# Patient Record
Sex: Female | Born: 1980
Health system: Southern US, Community
[De-identification: ages and names within clinical notes are randomized; demographics above are authoritative.]

## PROBLEM LIST (undated history)

## (undated) DIAGNOSIS — J45909 Unspecified asthma, uncomplicated: Secondary | ICD-10-CM

## (undated) DIAGNOSIS — R87629 Unspecified abnormal cytological findings in specimens from vagina: Secondary | ICD-10-CM

## (undated) DIAGNOSIS — Z8619 Personal history of other infectious and parasitic diseases: Secondary | ICD-10-CM

## (undated) DIAGNOSIS — G5600 Carpal tunnel syndrome, unspecified upper limb: Secondary | ICD-10-CM

## (undated) DIAGNOSIS — G43909 Migraine, unspecified, not intractable, without status migrainosus: Secondary | ICD-10-CM

## (undated) DIAGNOSIS — K219 Gastro-esophageal reflux disease without esophagitis: Secondary | ICD-10-CM

## (undated) DIAGNOSIS — O26899 Other specified pregnancy related conditions, unspecified trimester: Secondary | ICD-10-CM

## (undated) DIAGNOSIS — F419 Anxiety disorder, unspecified: Secondary | ICD-10-CM

## (undated) DIAGNOSIS — T7840XA Allergy, unspecified, initial encounter: Secondary | ICD-10-CM

## (undated) DIAGNOSIS — F32A Depression, unspecified: Secondary | ICD-10-CM

## (undated) HISTORY — DX: Anxiety disorder, unspecified: F41.9

## (undated) HISTORY — DX: Carpal tunnel syndrome, unspecified upper limb: G56.00

## (undated) HISTORY — DX: Other specified pregnancy related conditions, unspecified trimester: O26.899

## (undated) HISTORY — DX: Unspecified abnormal cytological findings in specimens from vagina: R87.629

## (undated) HISTORY — DX: Personal history of other infectious and parasitic diseases: Z86.19

## (undated) HISTORY — DX: Gastro-esophageal reflux disease without esophagitis: K21.9

## (undated) HISTORY — DX: Unspecified asthma, uncomplicated: J45.909

## (undated) HISTORY — DX: Migraine, unspecified, not intractable, without status migrainosus: G43.909

## (undated) HISTORY — PX: WISDOM TOOTH EXTRACTION: SHX21

## (undated) HISTORY — DX: Depression, unspecified: F32.A

## (undated) HISTORY — DX: Allergy, unspecified, initial encounter: T78.40XA

---

## 2003-03-29 ENCOUNTER — Other Ambulatory Visit: Admission: RE | Admit: 2003-03-29 | Discharge: 2003-03-29 | Payer: Self-pay | Admitting: Obstetrics & Gynecology

## 2003-10-22 ENCOUNTER — Inpatient Hospital Stay (HOSPITAL_COMMUNITY): Admission: AD | Admit: 2003-10-22 | Discharge: 2003-10-25 | Payer: Self-pay | Admitting: *Deleted

## 2003-12-02 ENCOUNTER — Other Ambulatory Visit: Admission: RE | Admit: 2003-12-02 | Discharge: 2003-12-02 | Payer: Self-pay | Admitting: Obstetrics & Gynecology

## 2009-02-12 ENCOUNTER — Inpatient Hospital Stay (HOSPITAL_COMMUNITY): Admission: AD | Admit: 2009-02-12 | Discharge: 2009-02-15 | Payer: Self-pay | Admitting: Obstetrics & Gynecology

## 2010-06-01 ENCOUNTER — Other Ambulatory Visit: Admission: RE | Admit: 2010-06-01 | Discharge: 2010-06-01 | Payer: Self-pay | Admitting: Gastroenterology

## 2010-12-29 LAB — CBC
HCT: 28.8 % — ABNORMAL LOW (ref 36.0–46.0)
HCT: 38.2 % (ref 36.0–46.0)
Hemoglobin: 13.3 g/dL (ref 12.0–15.0)
MCHC: 34.4 g/dL (ref 30.0–36.0)
MCHC: 34.7 g/dL (ref 30.0–36.0)
MCV: 97.5 fL (ref 78.0–100.0)
RBC: 2.95 MIL/uL — ABNORMAL LOW (ref 3.87–5.11)
RBC: 4 MIL/uL (ref 3.87–5.11)
RDW: 13.3 % (ref 11.5–15.5)
WBC: 10.4 10*3/uL (ref 4.0–10.5)

## 2011-02-05 NOTE — H&P (Signed)
NAMEJAXON, MYNHIER                           ACCOUNT NO.:  [id number]   MEDICAL RECORD NO.:  [id number]                   PATIENT TYPE:  INP   LOCATION:  9163                                 FACILITY:  [facility]   PHYSICIAN:  Genia Del, M.D.             DATE OF BIRTH:  [birth year]   DATE OF ADMISSION:  10/22/2003  DATE OF DISCHARGE:                                HISTORY & PHYSICAL   PATIENT IDENTIFICATION:  Mrs. Kathleen Conway is a 30 year old G1, 63 plus weeks  gestation; expected date of delivery October 26, 2003.   REASON FOR ADMISSION:  Induction, reason suspicion, occasional late  decelerations on monitoring at maternity admission.   HISTORY OF PRESENT ILLNESS:  The patient was seen at the office earlier  today.  She was found to be severely dehydrated because of a  gastroenteritis, probably viral in etiology.  She was sent for IV  rehydration.  On monitoring, after good rehydration, there were still  suspicious occasional late decelerations, in an otherwise reactive strip.  A  biophysical profile was done, which was 8/10 including the reactive NST; no  sustained breathing movements were present.  The amniotic fluid was normal.   The decision was made to induce the patient because of the occasional light  decelerations.   PAST MEDICAL HISTORY:  Negative.   PAST SURGICAL HISTORY:  Negative.   FAMILY HISTORY:  Positive for breast cancer in mother.  Colon cancer in  maternal grandfather.  Cardiovascular disease in maternal grandmother.   SOCIAL HISTORY:  Nonsmoker.  Married.   MEDICATIONS:  Prenatal vitamins.   HISTORY OF PRESENT PREGNANCY:  First trimester was normal.  Hemoglobin 12.5,  platelets 246, blood type Rh A positive.  DU positive.  Rh antibodies  negative.  Hemoglobin electrophoresis within normal limits.  RPR  nonreactive.  HbSAG nonreactive.  HIV nonreactive.  Rubella titer immune.  First trimester screening within normal limits.  Ultrasound dating at 10+  weeks  gave an expected date of delivery October 26, 2003.   In the second trimester alpha-fetoprotein was within normal limits.  Ultrasound revealed anatomy within normal limits, with a normal anterior  placenta.   One-hour GTT in the third trimester was within normal limits at 114.  Blood  pressures remained normal.  Uterine height corresponds well.  Group B strep  was negative at 35+ weeks.   REVIEW OF SYSTEMS:  CONSTITUTIONAL:  Negative.  HEENT:  Negative.  CARDIOVASCULAR:  Negative.  RESPIRATORY:  Negative.  GI:  Negative.  GENITOURINARY:  Negative.  ENDOCRINOLOGIC:  Negative.  DERMATOLOGIC:  Negative.  NEUROLOGIC:  Negative.   PHYSICAL EXAMINATION:  GENERAL:  No apparent distress.  VITAL SIGNS:  Blood pressure 100/66, pulse 102, respiratory rate 20,  temperature 98.4.  LUNGS:  Clear bilaterally.  CARDIAC:  Regular rhythm.  PELVIC:  Gravid uterus.  Cephalad presentation.  Vaginal examination on  admission reveals 1 cm dilated, 70% effaced, vertex  minus 2 with intact  membranes.  EXTREMITIES:  Lower limbs normal.  FETAL MONITORING:  (At Labor and Delivery) Showed a reactive monitoring,  with a baseline at 141-145; good accelerations.  Mild, occasional variable  decelerations were present.  Rare late appearing decelerations.  Uterine  contractions were irregular and mild.   IMPRESSION:  G1, 39+ weeks gestation with suspicious occasional late  decelerations on monitoring.  Overall fetal well being reassuring currently.  Group B strep negative.   PLAN:  Admit to Labor and Delivery.  Close continuous monitoring.  Induction  with low dose Pitocin.  Artificial rupture of membranes.                                               Genia Del, M.D.    ML/MEDQ  D:  10/22/2003  T:  10/22/2003  Job:  [id number]

## 2011-09-09 ENCOUNTER — Ambulatory Visit (INDEPENDENT_AMBULATORY_CARE_PROVIDER_SITE_OTHER): Payer: 59 | Admitting: Family Medicine

## 2011-09-09 DIAGNOSIS — Z23 Encounter for immunization: Secondary | ICD-10-CM

## 2011-09-09 DIAGNOSIS — M25569 Pain in unspecified knee: Secondary | ICD-10-CM

## 2011-09-09 DIAGNOSIS — F411 Generalized anxiety disorder: Secondary | ICD-10-CM

## 2011-09-09 DIAGNOSIS — G43009 Migraine without aura, not intractable, without status migrainosus: Secondary | ICD-10-CM

## 2011-12-02 ENCOUNTER — Ambulatory Visit (INDEPENDENT_AMBULATORY_CARE_PROVIDER_SITE_OTHER): Payer: 59 | Admitting: Family Medicine

## 2011-12-02 ENCOUNTER — Encounter: Payer: Self-pay | Admitting: Family Medicine

## 2011-12-02 VITALS — BP 104/68 | HR 80 | Temp 98.8°F | Resp 16 | Ht 60.5 in | Wt 132.2 lb

## 2011-12-02 DIAGNOSIS — G43909 Migraine, unspecified, not intractable, without status migrainosus: Secondary | ICD-10-CM | POA: Insufficient documentation

## 2011-12-02 MED ORDER — RIZATRIPTAN BENZOATE 10 MG PO TABS: 10.0000 mg | ORAL_TABLET | ORAL | Status: DC | PRN

## 2011-12-02 NOTE — Patient Instructions (Signed)
Information re: Headache Elimination Diet and Anti-Inflammatory Diet (print outs given)

## 2011-12-02 NOTE — Progress Notes (Signed)
  Subjective:    Patient ID: Kathleen Conway, female    DOB: [birth year], 31 y.o.   MRN: [id number]  HPI This pt returns for follow-up re: treatment of migraines. She cannot tolerate Topamax as she had  nausea and vomiting after one dose. She has kept a headache diary and found that she has occurrence  around her menses and when stressed. She has started to changes her diet and is avoiding temptation  to skip meals and snacks. She is taking Excedrin Migraine as this helps; her cousin who also has  Migraines advised her about correct administration of Maxalt. She is willing to go back to Maxalt.    Review of Systems  Constitutional: Negative.   HENT: Negative.   Cardiovascular: Negative.   Genitourinary: Positive for frequency. Negative for dysuria and urgency.       Has frequency immediately preceding a migraine  Neurological: Negative.   Psychiatric/Behavioral: Negative.        Objective:   Physical Exam  Nursing note and vitals reviewed. Constitutional: She is oriented to person, place, and time. She appears well-developed and well-nourished. No distress.  HENT:  Head: Normocephalic and atraumatic.  Eyes: EOM are normal. No scleral icterus.  Cardiovascular: Normal rate.   Pulmonary/Chest: Effort normal. No respiratory distress.  Neurological: She is alert and oriented to person, place, and time. No cranial nerve deficit.  Psychiatric: She has a normal mood and affect. Her behavior is normal. Thought content normal.          Assessment & Plan:  1. Migraine Headache    Rx: Maxalt 10 mg  Take as directed. Pt given print information re: Headache Elimination Diet and Anti-Inflammatory Diet; she will continue to make lifestyle changes                                                                                             RTC in 3-4 months

## 2012-05-16 ENCOUNTER — Encounter: Payer: Self-pay | Admitting: Family Medicine

## 2012-05-16 ENCOUNTER — Ambulatory Visit: Payer: 59 | Admitting: Family Medicine

## 2012-05-16 VITALS — BP 102/68 | HR 76 | Temp 97.9°F | Resp 16 | Ht 61.0 in | Wt 132.2 lb

## 2012-05-16 DIAGNOSIS — M25569 Pain in unspecified knee: Secondary | ICD-10-CM

## 2012-05-16 DIAGNOSIS — Z0000 Encounter for general adult medical examination without abnormal findings: Secondary | ICD-10-CM

## 2012-05-16 DIAGNOSIS — M25562 Pain in left knee: Secondary | ICD-10-CM

## 2012-05-16 DIAGNOSIS — G43909 Migraine, unspecified, not intractable, without status migrainosus: Secondary | ICD-10-CM

## 2012-05-16 DIAGNOSIS — G8929 Other chronic pain: Secondary | ICD-10-CM | POA: Insufficient documentation

## 2012-05-16 DIAGNOSIS — R292 Abnormal reflex: Secondary | ICD-10-CM

## 2012-05-16 LAB — POCT UA - MICROSCOPIC ONLY
Casts, Ur, LPF, POC: NEGATIVE
Crystals, Ur, HPF, POC: NEGATIVE
Yeast, UA: NEGATIVE

## 2012-05-16 LAB — POCT URINALYSIS DIPSTICK
Bilirubin, UA: NEGATIVE
Blood, UA: NEGATIVE
pH, UA: 7

## 2012-05-16 LAB — BASIC METABOLIC PANEL
BUN: 11 mg/dL (ref 6–23)
CO2: 25 mEq/L (ref 19–32)
Chloride: 106 mEq/L (ref 96–112)
Glucose, Bld: 93 mg/dL (ref 70–99)
Potassium: 3.9 mEq/L (ref 3.5–5.3)
Sodium: 139 mEq/L (ref 135–145)

## 2012-05-16 NOTE — Progress Notes (Signed)
Subjective:    Patient ID: Kathleen Conway, female    DOB: 30-Sep-1980, 31 y.o.   MRN: [id number]  HPI This 31 y.o. AA female is here for CPE only (she had PAP in June 2013 with Dr. Seymour Bars at  [facility]). She has chronic migraine headache disorder and diary has revealed  correlation with menstrual cycle. There are no food triggers. Headache is located usually in left  frontal area over eye. She did take OCPs in 2004 and  recalls no HAs at that time and no adverse  effects with the pill. She has had increased HAs since childbirth and worsening of migraine after  birth of 2nd child. She also notes some seasonality to migraines (less occurrence during the   summer and more in spring and fall).She is requesting increase of current Rizatriptan medication.   She also needs FMLA form signed for the coming year.     Review of Systems  Constitutional: Negative.   Eyes: Negative.   Respiratory: Negative.   Cardiovascular: Negative.   Gastrointestinal: Negative.   Genitourinary: Negative.   Musculoskeletal: Positive for joint swelling.       Left knee- chronic swelling and discomfort intermittently since 2010 (xray- negative)  Skin: Positive for color change.  Neurological: Positive for headaches. Negative for dizziness, syncope, speech difficulty, weakness and numbness.  Hematological: Negative.   Psychiatric/Behavioral: Positive for disturbed wake/sleep cycle. Negative for confusion, dysphoric mood, decreased concentration and agitation. The patient is not nervous/anxious.        Objective:   Physical Exam  Nursing note and vitals reviewed. Constitutional: She is oriented to person, place, and time. She appears well-developed and well-nourished. No distress.  HENT:  Head: Normocephalic and atraumatic.  Right Ear: Hearing, tympanic membrane, external ear and ear canal normal.  Left Ear: Hearing, tympanic membrane and external ear normal.  Nose: Nose normal. No nasal deformity  or septal deviation.  Mouth/Throat: Uvula is midline, oropharynx is clear and moist and mucous membranes are normal. No oral lesions. Normal dentition. No dental caries.  Eyes: Conjunctivae and EOM are normal. Pupils are equal, round, and reactive to light. No scleral icterus.  Neck: Normal range of motion. Neck supple.  Cardiovascular: Normal rate, regular rhythm and normal heart sounds.  Exam reveals no gallop and no friction rub.   No murmur heard. Pulmonary/Chest: Effort normal and breath sounds normal. No respiratory distress.  Abdominal: Soft. She exhibits no distension and no mass. There is no hepatosplenomegaly. There is no tenderness. There is no guarding and no CVA tenderness.  Musculoskeletal: Normal range of motion. She exhibits tenderness. She exhibits no edema.       Left knee- 3-4 scattered erythematous lesions (not raised; nonblanching) around joint. Tibial plateau tenderness but normal ROM. No effusion, no crepitus. No joint instability noted.  Lymphadenopathy:    She has no cervical adenopathy.  Neurological: She is alert and oriented to person, place, and time. She displays abnormal reflex. No cranial nerve deficit. She exhibits normal muscle tone. Coordination normal.  Reflex Scores:      Bicep reflexes are 3+ on the right side and 3+ on the left side.      Patellar reflexes are 3+ on the right side and 3+ on the left side. Skin: Skin is warm and dry.  Psychiatric: She has a normal mood and affect. Her behavior is normal. Judgment and thought content normal.    Results for orders placed in visit on 05/16/12  POCT URINALYSIS DIPSTICK  Component Value Range   Color, UA yellow     Clarity, UA clear     Glucose, UA neg     Bilirubin, UA neg     Ketones, UA trace     Spec Grav, UA 1.020     Blood, UA neg     pH, UA 7.0     Protein, UA trace     Urobilinogen, UA 1.0     Nitrite, UA neg     Leukocytes, UA moderate (2+)    POCT UA - MICROSCOPIC ONLY      Component  Value Range   WBC, Ur, HPF, POC 2-20     RBC, urine, microscopic 0-5     Bacteria, U Microscopic 1+     Mucus, UA moderate     Epithelial cells, urine per micros 0-tntc     Crystals, Ur, HPF, POC neg     Casts, Ur, LPF, POC neg     Yeast, UA neg            Assessment & Plan:   1. Routine general medical examination at a health care facility  POCT urinalysis dipstick, Basic metabolic panel, POCT UA - Microscopic Only  2. Migraine headache - have discussed with pt the use of Continuous Oral Contraception (i.e. LoSeasonique ) for treatment of menstrual migraine Continue current medication-Maxalt 10 mg prn. Try OTC Vit B12  1 mg  Taken once a day Get OTC supplement- Calcium+Magnesium+ Zinc and take 1 tablet daily Pt will consider use of OCPs but first wants to try other supplements noted above  3. Generalized hyperreflexia  TSH, T4, Free  4. Chronic pain of left knee  Sedimentation Rate   FMLA form is completed.

## 2012-05-16 NOTE — Patient Instructions (Addendum)
Based on our discussion today, you may have Menstrual migraine headache disorder which may respond to continuous contraception (like with LoSeasonique OCP). You would take this medication daily for 3 months and not have any menstrual bleeding. This may help reduce migraine headache occurrence.  I want you to give this some thought and continue to monitor your headaches.   Also I want you to try Vit B12 supplement 1 mg - take 1 tablet daily. Another supplement is Calcium+ Magnesium+ Zinc  Take 1 tablet daily. Get OTC Vit D 1000 IU and take this daily to keep Vitamin D level in normal range.

## 2012-05-17 NOTE — Progress Notes (Signed)
Quick Note:  Please notify pt that results are normal.   Provide pt with copy of labs. ______ 

## 2012-09-20 DIAGNOSIS — Z0271 Encounter for disability determination: Secondary | ICD-10-CM

## 2012-11-09 ENCOUNTER — Encounter: Payer: Self-pay | Admitting: Family Medicine

## 2012-11-09 ENCOUNTER — Ambulatory Visit (INDEPENDENT_AMBULATORY_CARE_PROVIDER_SITE_OTHER): Payer: 59 | Admitting: Family Medicine

## 2012-11-09 VITALS — BP 106/68 | HR 95 | Temp 98.5°F | Resp 16 | Ht 61.0 in | Wt 132.0 lb

## 2012-11-09 DIAGNOSIS — R040 Epistaxis: Secondary | ICD-10-CM

## 2012-11-09 DIAGNOSIS — J309 Allergic rhinitis, unspecified: Secondary | ICD-10-CM

## 2012-11-09 DIAGNOSIS — R350 Frequency of micturition: Secondary | ICD-10-CM

## 2012-11-09 DIAGNOSIS — G971 Other reaction to spinal and lumbar puncture: Secondary | ICD-10-CM

## 2012-11-09 DIAGNOSIS — G43909 Migraine, unspecified, not intractable, without status migrainosus: Secondary | ICD-10-CM

## 2012-11-09 LAB — POCT UA - MICROSCOPIC ONLY
Casts, Ur, LPF, POC: NEGATIVE
Crystals, Ur, HPF, POC: NEGATIVE

## 2012-11-09 LAB — POCT URINALYSIS DIPSTICK
Glucose, UA: NEGATIVE
Protein, UA: NEGATIVE
Spec Grav, UA: 1.025
Urobilinogen, UA: 0.2
pH, UA: 6

## 2012-11-09 MED ORDER — RIZATRIPTAN BENZOATE 10 MG PO TABS: 10.0000 mg | ORAL_TABLET | ORAL | Status: DC | PRN

## 2012-11-09 MED ORDER — CEFUROXIME AXETIL 250 MG PO TABS: 250.0000 mg | ORAL_TABLET | Freq: Two times a day (BID) | ORAL | Status: DC

## 2012-11-09 NOTE — Progress Notes (Signed)
S:  This 32 y.o. AA female is here for follow-up re: migraines. She has been awakening w/ HAs but thinks this is due to lack of sleep. HAs are L-sided.  HAs have been intermittent for 2+ years. She reports 2 episodes of epistaxis (from left nostril); she had nose bleeds as a child. Also has allergies and had red eyes last week; OTC allergy eye drops were effective. Nasal congestion is a problem, mostly at bedtime.   Urinary frequency (up to 4x hs) is disturbing sleep. She has more trips to restroom at work; she thinks it is stress-related. She denies fever, flank pain, hematuria or dysuria.  ROS: As per HPI; negative for sinus pain or pressure, nasal drainage or sore throat, neck pain or stiffness, vision disturbance or aura, n/v, tremor, numbness or weakness or syncope.   O: Filed Vitals:   11/09/12 1501  BP: 106/68  Pulse: 95  Temp: 98.5 F (36.9 C)  Resp: 16   GEN: In NAD; WN,WD. HEENT:  [place]/AT; EOMI w/ mild injection bilat. Nasal mucosa is red and boggy w/o active bleeding. EACs normal. NT sinuses. NECK: Supplpe w/o LAN or TMG; no muscle spasms. COR: RRR. LUNGS: Normal resp rate and effort. NEURO: A&O x 3; Cns intact. Nonfocal.   Results for orders placed in visit on 11/09/12  URINE CULTURE      Result Value Range   Colony Count NO GROWTH     Organism ID, Bacteria NO GROWTH    POCT URINALYSIS DIPSTICK      Result Value Range   Color, UA yellow     Clarity, UA slightly cloudy     Glucose, UA neg     Bilirubin, UA neg     Ketones, UA neg     Spec Grav, UA 1.025     Blood, UA trace     pH, UA 6.0     Protein, UA neg     Urobilinogen, UA 0.2     Nitrite, UA neg     Leukocytes, UA small (1+)    POCT UA - MICROSCOPIC ONLY      Result Value Range   WBC, Ur, HPF, POC 8-tntc     RBC, urine, microscopic 0-4     Bacteria, U Microscopic trace     Mucus, UA trace     Epithelial cells, urine per micros 3-16     Crystals, Ur, HPF, POC neg     Casts, Ur, LPF, POC neg     Yeast, UA neg       A/P: Urinary frequency - Plan: Urine culture; RX; Cefuroxime 250 mg 1 tablet bid x 1 week pending culture.  Migraine, unspecified, without mention of intractable migraine without mention of status migrainosus - RF: Maxalt 10 mg tablets.  Plan: CT Head Wo Contrast  Allergic rhinitis- use OTC antihistamine and humidifier in bedroom while sleeping.  Epistaxis   Meds ordered this encounter  Medications  . etonogestrel-ethinyl estradiol (NUVARING) 0.12-0.015 MG/24HR vaginal ring    Sig: Place 1 each vaginally every 28 (twenty-eight) days. Insert vaginally and leave in place for 3 consecutive weeks, then remove for 1 week.  . cefUROXime (CEFTIN) 250 MG tablet    Sig: Take 1 tablet (250 mg total) by mouth 2 (two) times daily.    Dispense:  14 tablet    Refill:  0  . rizatriptan (MAXALT) 10 MG tablet    Sig: Take 1 tablet (10 mg total) by mouth as needed for migraine. May  repeat in 2 hours if needed    Dispense:  10 tablet    Refill:  2

## 2012-11-09 NOTE — Patient Instructions (Addendum)
Allergic Rhinitis Allergic rhinitis is when the mucous membranes in the nose respond to allergens. Allergens are particles in the air that cause your body to have an allergic reaction. This causes you to release allergic antibodies. Through a chain of events, these eventually cause you to release histamine into the blood stream (hence the use of antihistamines). Although meant to be protective to the body, it is this release that causes your discomfort, such as frequent sneezing, congestion and an itchy runny nose.  CAUSES  The pollen allergens may come from grasses, trees, and weeds. This is seasonal allergic rhinitis, or "hay fever." Other allergens cause year-round allergic rhinitis (perennial allergic rhinitis) such as house dust mite allergen, pet dander and mold spores.  SYMPTOMS   Nasal stuffiness (congestion).  Runny, itchy nose with sneezing and tearing of the eyes.  There is often an itching of the mouth, eyes and ears. It cannot be cured, but it can be controlled with medications. DIAGNOSIS  If you are unable to determine the offending allergen, skin or blood testing may find it. TREATMENT   Avoid the allergen.  Medications and allergy shots (immunotherapy) can help.  Hay fever may often be treated with antihistamines in pill or nasal spray forms. Antihistamines block the effects of histamine. There are over-the-counter medicines that may help with nasal congestion and swelling around the eyes. Check with your caregiver before taking or giving this medicine. If the treatment above does not work, there are many new medications your caregiver can prescribe. Stronger medications may be used if initial measures are ineffective. Desensitizing injections can be used if medications and avoidance fails. Desensitization is when a patient is given ongoing shots until the body becomes less sensitive to the allergen. Make sure you follow up with your caregiver if problems continue. SEEK MEDICAL  CARE IF:   You develop fever (more than 100.5 F (38.1 C).  You develop a cough that does not stop easily (persistent).  You have shortness of breath.  You start wheezing.  Symptoms interfere with normal daily activities. Document Released: 06/01/2001 Document Revised: 11/29/2011 Document Reviewed: 12/11/2008 [facility] Patient Information 2013 [place], [place].   Get a humidifier and use in the bedroom as you sleep. You can get generic Zyrtec or Allegra (with or without decongestant) and take this medication every evening to help reduce swelling in nasal passages.  Your urine is being sent for culture to check for bladder infection. I will prescribe days of medication as a precaution.

## 2012-11-11 LAB — URINE CULTURE: Colony Count: NO GROWTH

## 2012-11-17 ENCOUNTER — Telehealth: Payer: Self-pay

## 2012-11-20 ENCOUNTER — Ambulatory Visit
Admission: RE | Admit: 2012-11-20 | Discharge: 2012-11-20 | Disposition: A | Discharge status: Home / Self Care | Payer: 59 | Source: Ambulatory Visit | Attending: Family Medicine | Admitting: Family Medicine

## 2012-11-20 DIAGNOSIS — G43909 Migraine, unspecified, not intractable, without status migrainosus: Secondary | ICD-10-CM

## 2012-11-21 ENCOUNTER — Encounter: Payer: Self-pay | Admitting: Family Medicine

## 2013-03-15 ENCOUNTER — Ambulatory Visit (INDEPENDENT_AMBULATORY_CARE_PROVIDER_SITE_OTHER): Payer: 59 | Admitting: Family Medicine

## 2013-03-15 ENCOUNTER — Encounter: Payer: Self-pay | Admitting: Family Medicine

## 2013-03-15 VITALS — BP 95/59 | HR 86 | Temp 97.9°F | Resp 16 | Ht 60.5 in | Wt 127.0 lb

## 2013-03-15 DIAGNOSIS — G43909 Migraine, unspecified, not intractable, without status migrainosus: Secondary | ICD-10-CM

## 2013-03-15 DIAGNOSIS — G8929 Other chronic pain: Secondary | ICD-10-CM

## 2013-03-15 DIAGNOSIS — M25562 Pain in left knee: Secondary | ICD-10-CM

## 2013-03-15 DIAGNOSIS — M25569 Pain in unspecified knee: Secondary | ICD-10-CM

## 2013-03-15 MED ORDER — RIZATRIPTAN BENZOATE 10 MG PO TABS: 10.0000 mg | ORAL_TABLET | ORAL | Status: DC | PRN

## 2013-03-15 NOTE — Progress Notes (Signed)
S:  This 32 y.o. AA female has migraine HA disorder; HA frequency decreased such that Aleve usually relieves frontal area discomfort. Early morning HAs have been awakening pt in last few weeks. She notes HAs w/ weather changes and when allergy symptoms are not well controlled. She has not used Maxalt in several months. She admits hydration is fair; avoids drinking much water during the day to limit bathroom breaks at work. Recently had vision evaluation.  Chronic L knee pain- this has been a problem for years. Xrays 3 years ago were normal. No recent trauma but pt was very athletic as a teen. 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 She did sprain knee while participating in these sports. Now, she has persistent red splotches around L knee joint but has no swelling. She states knee "just aches". Family hx is essentially negative for arthritis diagnoses. Pt denies fever, fatigue, myalgias, other arthralgias, rashes, erythema, bruising or enlarged lymph nodes.  Pt reports that she is no longer using contraception; she is not actively trying to conceive but, if pregnancy occurs, it would be welcomed.  Patient Active Problem List   Diagnosis Date Noted  . Chronic pain of left knee 05/16/2012  . Migraine headache 12/02/2011   PMHx, Soc Hx and Fam Hx reviewed.  ROS: As per HPI.  O: Filed Vitals:   03/15/13 1621  BP: 95/59  Pulse: 86  Temp: 97.9 F (36.6 C)  Resp: 16   GEN: In NAD; WN,WD. HEENT: [place]/AT. NT sinuses. EOMI w/ clear conj/sclerae. EACs/ TMs normal. Nasal mucosa w/ enlarged turbinates. Post ph w/ mild cobblestoning. NECK: Supple w/o LAN or TMG. COR: RRR. LUNGS: CTA. Normal resp rate and effort. BACK: No muscle tenderness. Spine straight w/o curvature. MS: MAEs; no joint effusions or crepitus. No deformities. L knee- FROM; small flat red lesions around patella. No obvious leg length discrepancy. NEURO: A&Ox 3; CNs intact. Motor/sensory intact. Gait normal. Nonfocal.    A/P: Migraine, unspecified, without mention of intractable migraine without mention of status migrainosus- Improve hydration and sleep hygiene. 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 Take allergy medication daily.  Chronic knee pain, left - Plan: Ambulatory referral to Orthopedic Surgery  Meds ordered this encounter  Medications  . rizatriptan (MAXALT) 10 MG tablet    Sig: Take 1 tablet (10 mg total) by mouth as needed for migraine. May repeat in 2 hours if needed    Dispense:  10 tablet    Refill:  3

## 2013-03-15 NOTE — Patient Instructions (Addendum)
For headache disorder- try putting AIR PURIFIERS in your bedroom and maybe in the family room. This device will help clean the air which is full of allergens. Maintain adequate hydration, especially in the summer.  I have referred you to Dr. Althea Charon at [facility]. They will probably need to do xrays and other imaging as warranted.  You can call back to schedule your next appointment (physical without PAP) before the end of the year.

## 2013-07-26 ENCOUNTER — Other Ambulatory Visit: Payer: Self-pay

## 2013-08-31 ENCOUNTER — Encounter: Payer: Self-pay | Admitting: Family Medicine

## 2013-08-31 ENCOUNTER — Ambulatory Visit (INDEPENDENT_AMBULATORY_CARE_PROVIDER_SITE_OTHER): Payer: 59 | Admitting: Family Medicine

## 2013-08-31 VITALS — BP 120/76 | HR 65 | Temp 98.3°F | Resp 16 | Ht 61.0 in | Wt 128.0 lb

## 2013-08-31 DIAGNOSIS — Z23 Encounter for immunization: Secondary | ICD-10-CM

## 2013-08-31 DIAGNOSIS — G43909 Migraine, unspecified, not intractable, without status migrainosus: Secondary | ICD-10-CM

## 2013-08-31 MED ORDER — RIZATRIPTAN BENZOATE 10 MG PO TABS: 10.0000 mg | ORAL_TABLET | ORAL | Status: DC | PRN

## 2013-08-31 NOTE — Patient Instructions (Signed)
Try a cup of warm green tea in the morning during the winter to see if that will help minimize your headaches.

## 2013-09-02 NOTE — Progress Notes (Signed)
S:  This 32 y.o. AA female has migraine HA disorder that is controlled on current medication (prn Maxalt generic).She notes increased HA frequency w/  weather changes and around time of menses.  With onset of cold weather, she wakes up w/ sinus pressure, nasal congestion and frontal HA. In general, she is doing well and requests continuation of current medications.  Patient Active Problem List   Diagnosis Date Noted  . Chronic pain of left knee 05/16/2012  . Migraine headache 12/02/2011   PMHx, Soc Hx and Fam Hx reviewed.  Medications reconciled.  ROS: Noncontributory.  O: Filed Vitals:   08/31/13 1625  BP: 120/76  Pulse: 65  Temp: 98.3 F (36.8 C)  Resp: 16   GEN: In NAD: WN,WD. HENT: [place]/AT; EOMI w/ clear conj/sclerae. Nasal mucosa erythematous and edematous. Post ph normal. NT sinuses. NECK: Supple w/o LAN. COR: RRR. LUNGS: Unlabored resp. SKIN: W&D; intact w/o diaphoresis or erythema. NEURO: A&Ox 3; CNs intact. Nonfocal.  A/P: Migraine headache- Stable on current medication; continue prn Maxalt (generic). Advised warm liquids and humidifier in the winter.  Need for prophylactic vaccination and inoculation against influenza - Plan: Flu Vaccine QUAD 36+ mos IM

## 2013-09-05 NOTE — Telephone Encounter (Signed)
Error

## 2013-11-20 DIAGNOSIS — Z029 Encounter for administrative examinations, unspecified: Secondary | ICD-10-CM

## 2013-11-26 ENCOUNTER — Telehealth: Payer: Self-pay

## 2013-11-26 ENCOUNTER — Encounter: Payer: Self-pay | Admitting: Family Medicine

## 2013-11-26 NOTE — Telephone Encounter (Signed)
FMLA forms are currently with Dr.Mcphersons Box awaiting completion.

## 2013-11-26 NOTE — Telephone Encounter (Addendum)
FORMS HAVE BEEN COMPLETED AND FAXED THERE IS A COPY IN THE P/U DRAWER IF PT WOULD LIKE TO PICK THIS UP AND DOCUMENTS HAVE BEEN SCANNED AND UNDER THE MEDIA TAB. THE FAX DID GO THROUGH TO [employer].

## 2014-06-07 ENCOUNTER — Ambulatory Visit (INDEPENDENT_AMBULATORY_CARE_PROVIDER_SITE_OTHER): Payer: 59 | Admitting: Family Medicine

## 2014-06-07 ENCOUNTER — Encounter: Payer: Self-pay | Admitting: Family Medicine

## 2014-06-07 ENCOUNTER — Encounter: Payer: 59 | Admitting: Family Medicine

## 2014-06-07 VITALS — BP 96/60 | HR 65 | Temp 97.9°F | Resp 16 | Ht 61.5 in | Wt 122.0 lb

## 2014-06-07 DIAGNOSIS — Z0000 Encounter for general adult medical examination without abnormal findings: Secondary | ICD-10-CM

## 2014-06-07 DIAGNOSIS — G43009 Migraine without aura, not intractable, without status migrainosus: Secondary | ICD-10-CM

## 2014-06-07 DIAGNOSIS — Z23 Encounter for immunization: Secondary | ICD-10-CM

## 2014-06-07 DIAGNOSIS — J309 Allergic rhinitis, unspecified: Secondary | ICD-10-CM

## 2014-06-07 DIAGNOSIS — Z569 Unspecified problems related to employment: Secondary | ICD-10-CM

## 2014-06-07 DIAGNOSIS — R5381 Other malaise: Secondary | ICD-10-CM

## 2014-06-07 DIAGNOSIS — Z566 Other physical and mental strain related to work: Secondary | ICD-10-CM

## 2014-06-07 DIAGNOSIS — R5383 Other fatigue: Secondary | ICD-10-CM

## 2014-06-07 LAB — CBC WITH DIFFERENTIAL/PLATELET
Basophils Absolute: 0 10*3/uL (ref 0.0–0.1)
Basophils Relative: 0 % (ref 0–1)
EOS ABS: 0.1 10*3/uL (ref 0.0–0.7)
EOS PCT: 1 % (ref 0–5)
HEMATOCRIT: 36 % (ref 36.0–46.0)
HEMOGLOBIN: 12.4 g/dL (ref 12.0–15.0)
LYMPHS ABS: 2.5 10*3/uL (ref 0.7–4.0)
LYMPHS PCT: 39 % (ref 12–46)
MCH: 30.5 pg (ref 26.0–34.0)
MCHC: 34.4 g/dL (ref 30.0–36.0)
MCV: 88.7 fL (ref 78.0–100.0)
MONO ABS: 0.4 10*3/uL (ref 0.1–1.0)
MONOS PCT: 6 % (ref 3–12)
Neutro Abs: 3.5 10*3/uL (ref 1.7–7.7)
Neutrophils Relative %: 54 % (ref 43–77)
PLATELETS: 309 10*3/uL (ref 150–400)
RBC: 4.06 MIL/uL (ref 3.87–5.11)
RDW: 13.1 % (ref 11.5–15.5)
WBC: 6.4 10*3/uL (ref 4.0–10.5)

## 2014-06-07 LAB — POCT URINALYSIS DIPSTICK
BILIRUBIN UA: NEGATIVE
Blood, UA: NEGATIVE
Glucose, UA: NEGATIVE
KETONES UA: NEGATIVE
LEUKOCYTES UA: NEGATIVE
NITRITE UA: NEGATIVE
PROTEIN UA: NEGATIVE
Spec Grav, UA: 1.015
Urobilinogen, UA: 0.2
pH, UA: 5.5

## 2014-06-07 LAB — COMPLETE METABOLIC PANEL WITH GFR
ALT: 8 U/L (ref 0–35)
AST: 12 U/L (ref 0–37)
Albumin: 4.5 g/dL (ref 3.5–5.2)
Alkaline Phosphatase: 50 U/L (ref 39–117)
BILIRUBIN TOTAL: 0.6 mg/dL (ref 0.2–1.2)
BUN: 7 mg/dL (ref 6–23)
CALCIUM: 9.7 mg/dL (ref 8.4–10.5)
CHLORIDE: 103 meq/L (ref 96–112)
CO2: 27 meq/L (ref 19–32)
CREATININE: 0.79 mg/dL (ref 0.50–1.10)
GLUCOSE: 80 mg/dL (ref 70–99)
Potassium: 4 mEq/L (ref 3.5–5.3)
Sodium: 138 mEq/L (ref 135–145)
Total Protein: 6.8 g/dL (ref 6.0–8.3)

## 2014-06-07 LAB — LIPID PANEL
CHOL/HDL RATIO: 2.9 ratio
CHOLESTEROL: 179 mg/dL (ref 0–200)
HDL: 62 mg/dL (ref >39)
LDL Cholesterol: 105 mg/dL — ABNORMAL HIGH (ref 0–99)
Triglycerides: 59 mg/dL (ref <150)
VLDL: 12 mg/dL (ref 0–40)

## 2014-06-07 MED ORDER — RIZATRIPTAN BENZOATE 10 MG PO TABS: 10.0000 mg | ORAL_TABLET | ORAL | Status: DC | PRN

## 2014-06-07 NOTE — Progress Notes (Signed)
Subjective:    Patient ID: Kathleen Conway, female    DOB: September 06, 1981, 33 y.o.   MRN: [id number]  HPI  This 33 y.o. AA female is here for CPE. PAP/pelvic/GYN care provided by Dr. Dellis Filbert (current). Chronic medical conditions include migraine HA disorder and environmental allergies.   HCM: PAP- As noted.           MMG- Per GYN recommendation.           IMM- Current.           Vision- > 2 years.   Patient Active Problem List   Diagnosis Date Noted  . Chronic pain of left knee 05/16/2012  . Migraine headache 12/02/2011    Prior to Admission medications   Medication Sig Start Date End Date Taking? Authorizing Provider  aspirin-acetaminophen-caffeine (EXCEDRIN MIGRAINE) [phone] MG per tablet Take 1 tablet by mouth every 6 (six) hours as needed.   Yes Historical Provider, MD  Multiple Vitamin (MULTIVITAMIN) tablet Take 1 tablet by mouth daily.   Yes Historical Provider, MD  rizatriptan (MAXALT) 10 MG tablet Take 1 tablet (10 mg total) by mouth as needed for migraine. May repeat in 2 hours if needed 06/07/14 06/07/15 Yes Barton Fanny, MD  Norgestim-Eth Radene Journey Triphasic [facility] TRI-CYCLEN, 28, PO) Take by mouth.    Historical Provider, MD    History   Social History  . Marital Status: Married    Spouse Name: N/A    Number of Children: 2 sons  . Years of Education: college   Occupational History  . customer service representative    Social History Main Topics  . Smoking status: Never Smoker   . Smokeless tobacco: Never Used  . Alcohol Use: Yes     Comment: socially-wine  . Drug Use: Not on file  . Sexual Activity: Yes   Other Topics Concern  . Not on file   Social History Narrative  . No narrative on file    Family History  Problem Relation Age of Onset  . Cancer Mother 66    breast  . Cancer Father 86    prostate  . Stroke Maternal Grandfather   . Cancer Maternal Grandfather     colon  . Cancer Maternal Aunt     Breast and lung  . Cancer Maternal  Uncle     Lung  . Cancer Paternal Grandfather      Review of Systems  Constitutional: Positive for fatigue. Negative for activity change, appetite change and unexpected weight change.  HENT: Positive for congestion. Negative for sinus pressure and sore throat.   Eyes: Negative.   Respiratory: Negative.   Cardiovascular: Negative.   Gastrointestinal: Negative.   Endocrine: Negative.   Genitourinary: Negative.   Musculoskeletal: Negative.   Skin: Negative.   Allergic/Immunologic: Positive for environmental allergies.  Neurological: Positive for headaches.  Psychiatric/Behavioral: Positive for dysphoric mood, decreased concentration and agitation.       Associated w/ lack of career satisfaction; is carving out more personal time and hs decided to pursue Master's Degree (online education).       Objective:   Physical Exam  Nursing note and vitals reviewed. Constitutional: She is oriented to person, place, and time. Vital signs are normal. She appears well-developed and well-nourished.  HENT:  Head: Normocephalic and atraumatic.  Right Ear: Hearing, tympanic membrane, external ear and ear canal normal.  Left Ear: Hearing, tympanic membrane, external ear and ear canal normal.  Nose: Mucosal edema present. No nasal deformity or  septal deviation. Right sinus exhibits no maxillary sinus tenderness and no frontal sinus tenderness. Left sinus exhibits no maxillary sinus tenderness and no frontal sinus tenderness.  Mouth/Throat: Uvula is midline and mucous membranes are normal. No oral lesions. Normal dentition. No dental caries. Posterior oropharyngeal erythema present. No oropharyngeal exudate.  Eyes: Conjunctivae, EOM and lids are normal. Pupils are equal, round, and reactive to light. No scleral icterus.  Fundoscopic exam:      The right eye shows no papilledema. The right eye shows red reflex.       The left eye shows no papilledema. The left eye shows red reflex.  Neck: Trachea  normal, normal range of motion, full passive range of motion without pain and phonation normal. Neck supple. No spinous process tenderness and no muscular tenderness present. No mass and no thyromegaly present.  Cardiovascular: Normal rate, regular rhythm, S1 normal, S2 normal, normal heart sounds, intact distal pulses and normal pulses.   No extrasystoles are present. PMI is not displaced.  Exam reveals no gallop and no friction rub.   No murmur heard. Pulmonary/Chest: Effort normal and breath sounds normal. No respiratory distress.  Abdominal: Soft. Normal appearance and bowel sounds are normal. She exhibits no distension and no mass. There is no hepatosplenomegaly. There is no tenderness. There is no guarding and no CVA tenderness.  Genitourinary:  Breast and pelvic exam deferred.  Musculoskeletal:       Cervical back: Normal.       Thoracic back: Normal.       Lumbar back: Normal.  Remainder of exam unremarkable.  Lymphadenopathy:       Head (right side): No submental, no submandibular, no tonsillar, no preauricular, no posterior auricular and no occipital adenopathy present.       Head (left side): No submental, no submandibular, no tonsillar, no preauricular, no posterior auricular and no occipital adenopathy present.    She has no cervical adenopathy.       Right: No supraclavicular adenopathy present.       Left: No supraclavicular adenopathy present.  Neurological: She is alert and oriented to person, place, and time. She has normal strength and normal reflexes. She displays no atrophy. No cranial nerve deficit or sensory deficit. She exhibits normal muscle tone. Coordination and gait normal.  Skin: Skin is warm, dry and intact. No ecchymosis, no lesion and no rash noted. She is not diaphoretic. No cyanosis or erythema. Nails show no clubbing.  Psychiatric: She has a normal mood and affect. Her speech is normal and behavior is normal. Judgment and thought content normal. Cognition and  memory are normal.  Somewhat tearful. Pt able to articulate what issues she is dealing with and has a plan for moving forward. PHQ-9 score= 1.     Results for orders placed in visit on 06/07/14  POCT URINALYSIS DIPSTICK      Result Value Ref Range   Color, UA yellow     Clarity, UA clear     Glucose, UA neg     Bilirubin, UA neg     Ketones, UA neg     Spec Grav, UA 1.015     Blood, UA neg     pH, UA 5.5     Protein, UA neg     Urobilinogen, UA 0.2     Nitrite, UA neg     Leukocytes, UA Negative         Assessment & Plan:  Routine general medical examination at a health care  facility - Plan: POCT urinalysis dipstick, Lipid panel, CBC with Differential, COMPLETE METABOLIC PANEL WITH GFR, Vit D  25 hydroxy (rtn osteoporosis monitoring), Thyroid Panel With TSH  Work-related stress- Pt has outlined some goals for the near future which helps her feel a sense of control. She plans to pursue healthy ways of stress reduction. If symptoms worsen, pt will consider medication.  Migraine without aura and without status migrainosus, not intractable- Stable; continue Maxalt prn. 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  Other fatigue- Associated w/ job stress and other responsibilities or marriage and parenthood.  Allergic rhinitis, unspecified allergic rhinitis type  Need for prophylactic vaccination and inoculation against influenza - Plan: Flu Vaccine QUAD 36+ mos IM   Meds ordered this encounter  Medications  . rizatriptan (MAXALT) 10 MG tablet    Sig: Take 1 tablet (10 mg total) by mouth as needed for migraine. May repeat in 2 hours if needed    Dispense:  10 tablet    Refill:  5

## 2014-06-07 NOTE — Patient Instructions (Signed)
Keeping You Healthy  Get These Tests 1. Blood Pressure- Have your blood pressure checked once a year by your health care provider.  Normal blood pressure is 120/80. 2. Weight- Have your body mass index (BMI) calculated to screen for obesity.  BMI is measure of body fat based on height and weight.  You can also calculate your own BMI at GravelBags.it. 3. Cholesterol- Have your cholesterol checked every 5 years starting at age 35 then yearly starting at age 35. 88. Chlamydia, HIV, and other sexually transmitted diseases- Get screened every year until age 35, then within three months of each new sexual provider. 5. Pap Smear- Every 1-3 years; discuss with your health care provider. 6. Mammogram- Every year starting at age 35  Take these medicines  Calcium with Vitamin D-Your body needs 1200 mg of Calcium each day and [phone] IU of Vitamin D daily.  Your body can only absorb 500 mg of Calcium at a time so Calcium must be taken in 2 or 3 divided doses throughout the day.  Multivitamin with folic acid- Once daily if it is possible for you to become pregnant.  Get these Immunizations  Gardasil-Series of three doses; prevents HPV related illness such as genital warts and cervical cancer.  Menactra-Single dose; prevents meningitis.  Tetanus shot- Every 10 years.  Flu shot-Every year.  Take these steps 1. Do not smoke-Your healthcare provider can help you quit.  For tips on how to quit go to www.smokefree.gov or call 1-800 QUITNOW. 2. Be physically active- Exercise 5 days a week for at least 30 minutes.  If you are not already physically active, start slow and gradually work up to 30 minutes of moderate physical activity.  Examples of moderate activity include walking briskly, dancing, swimming, bicycling, etc. 3. Breast Cancer- A self breast exam every month is important for early detection of breast cancer.  For more information and instruction on self breast exams, ask your  healthcare provider or https://www.patel.info/. 4. Eat a healthy diet- Eat a variety of healthy foods such as fruits, vegetables, whole grains, low fat milk, low fat cheeses, yogurt, lean meats, poultry and fish, beans, nuts, tofu, etc.  For more information go to www. Thenutritionsource.org 5. Drink alcohol in moderation- Limit alcohol intake to one drink or less per day. Never drink and drive. 6. Depression- Your emotional health is as important as your physical health.  If you're feeling down or losing interest in things you normally enjoy please talk to your healthcare provider about being screened for depression. 7. Dental visit- Brush and floss your teeth twice daily; visit your dentist twice a year. 8. Eye doctor- Get an eye exam at least every 2 years. 9. Helmet use- Always wear a helmet when riding a bicycle, motorcycle, rollerblading or skateboarding. 40. Safe sex- If you may be exposed to sexually transmitted infections, use a condom. 11. Seat belts- Seat belts can save your live; always wear one. 12. Smoke/Carbon Monoxide detectors- These detectors need to be installed on the appropriate level of your home. Replace batteries at least once a year. 13. Skin cancer- When out in the sun please cover up and use sunscreen 15 SPF or higher. 14. Violence- If anyone is threatening or hurting you, please tell your healthcare provider.    Stress and Stress Management Stress is a normal reaction to life events. It is what you feel when life demands more than you are used to or more than you can handle. Some stress can be useful.  For example, the stress reaction can help you catch the last bus of the day, study for a test, or meet a deadline at work. But stress that occurs too often or for too long can cause problems. It can affect your emotional health and interfere with relationships and normal daily activities. Too much stress can weaken your immune system and increase your  risk for physical illness. If you already have a medical problem, stress can make it worse. CAUSES  All sorts of life events may cause stress. An event that causes stress for one person may not be stressful for another person. Major life events commonly cause stress. These may be positive or negative. Examples include losing your job, moving into a new home, getting married, having a baby, or losing a loved one. Less obvious life events may also cause stress, especially if they occur day after day or in combination. Examples include working long hours, driving in traffic, caring for children, being in debt, or being in a difficult relationship. SIGNS AND SYMPTOMS Stress may cause emotional symptoms including, the following:  Anxiety. This is feeling worried, afraid, on edge, overwhelmed, or out of control.  Anger. This is feeling irritated or impatient.  Depression. This is feeling sad, down, helpless, or guilty.  Difficulty focusing, remembering, or making decisions. Stress may cause physical symptoms, including the following:   Aches and pains. These may affect your head, neck, back, stomach, or other areas of your body.  Tight muscles or clenched jaw.  Low energy or trouble sleeping. Stress may cause unhealthy behaviors, including the following:   Eating to feel better (overeating) or skipping meals.  Sleeping too little, too much, or both.  Working too much or putting off tasks (procrastination).  Smoking, drinking alcohol, or using drugs to feel better. DIAGNOSIS  Stress is diagnosed through an assessment by your health care provider. Your health care provider will ask questions about your symptoms and any stressful life events.Your health care provider will also ask about your medical history and may order blood tests or other tests. Certain medical conditions and medicine can cause physical symptoms similar to stress. Mental illness can cause emotional symptoms and unhealthy  behaviors similar to stress. Your health care provider may refer you to a mental health professional for further evaluation.  TREATMENT  Stress management is the recommended treatment for stress.The goals of stress management are reducing stressful life events and coping with stress in healthy ways.  Techniques for reducing stressful life events include the following:  Stress identification. Self-monitor for stress and identify what causes stress for you. These skills may help you to avoid some stressful events.  Time management. Set your priorities, keep a calendar of events, and learn to say "no." These tools can help you avoid making too many commitments. Techniques for coping with stress include the following:  Rethinking the problem. Try to think realistically about stressful events rather than ignoring them or overreacting. Try to find the positives in a stressful situation rather than focusing on the negatives.  Exercise. Physical exercise can release both physical and emotional tension. The key is to find a form of exercise you enjoy and do it regularly.  Relaxation techniques. These relax the body and mind. Examples include yoga, meditation, tai chi, biofeedback, deep breathing, progressive muscle relaxation, listening to music, being out in nature, journaling, and other hobbies. Again, the key is to find one or more that you enjoy and can do regularly.  Healthy lifestyle. Eat a  balanced diet, get plenty of sleep, and do not smoke. Avoid using alcohol or drugs to relax.  Strong support network. Spend time with family, friends, or other people you enjoy being around.Express your feelings and talk things over with someone you trust. Counseling or talktherapy with a mental health professional may be helpful if you are having difficulty managing stress on your own. Medicine is typically not recommended for the treatment of stress.Talk to your health care provider if you think you need  medicine for symptoms of stress. HOME CARE INSTRUCTIONS  Keep all follow-up visits as directed by your health care provider.  Take all medicines as directed by your health care provider. SEEK MEDICAL CARE IF:  Your symptoms get worse or you start having new symptoms.  You feel overwhelmed by your problems and can no longer manage them on your own. SEEK IMMEDIATE MEDICAL CARE IF:  You feel like hurting yourself or someone else. Document Released: 03/02/2001 Document Revised: 01/21/2014 Document Reviewed: 05/01/2013 [employer] Patient Information 2015 [place], [place]. This information is not intended to replace advice given to you by your health care provider. Make sure you discuss any questions you have with your health care provider.

## 2014-06-08 DIAGNOSIS — J309 Allergic rhinitis, unspecified: Secondary | ICD-10-CM | POA: Insufficient documentation

## 2014-06-08 DIAGNOSIS — Z566 Other physical and mental strain related to work: Secondary | ICD-10-CM | POA: Insufficient documentation

## 2014-06-08 DIAGNOSIS — J3089 Other allergic rhinitis: Secondary | ICD-10-CM | POA: Insufficient documentation

## 2014-06-08 LAB — THYROID PANEL WITH TSH
Free Thyroxine Index: 2 (ref 1.4–3.8)
T3 UPTAKE: 30 % (ref 22.0–35.0)
T4 TOTAL: 6.5 ug/dL (ref 4.5–12.0)
TSH: 1.188 u[IU]/mL (ref 0.350–4.500)

## 2014-06-08 LAB — VITAMIN D 25 HYDROXY (VIT D DEFICIENCY, FRACTURES): Vit D, 25-Hydroxy: 27 ng/mL — ABNORMAL LOW (ref 30–89)

## 2014-08-16 ENCOUNTER — Encounter (HOSPITAL_COMMUNITY): Payer: Self-pay | Admitting: *Deleted

## 2014-08-19 ENCOUNTER — Other Ambulatory Visit: Payer: Self-pay | Admitting: Obstetrics & Gynecology

## 2014-08-20 ENCOUNTER — Ambulatory Visit (HOSPITAL_COMMUNITY)
Admission: AD | Admit: 2014-08-20 | Discharge: 2014-08-20 | Disposition: A | Discharge status: Home / Self Care | Payer: 59 | Source: Ambulatory Visit | Attending: Obstetrics & Gynecology | Admitting: Obstetrics & Gynecology

## 2014-08-20 ENCOUNTER — Encounter (HOSPITAL_COMMUNITY): Payer: Self-pay | Admitting: Anesthesiology

## 2014-08-20 ENCOUNTER — Ambulatory Visit (HOSPITAL_COMMUNITY): Payer: 59 | Admitting: Certified Registered Nurse Anesthetist

## 2014-08-20 ENCOUNTER — Encounter (HOSPITAL_COMMUNITY)
Admission: AD | Disposition: A | Discharge status: Home / Self Care | Payer: Self-pay | Source: Ambulatory Visit | Attending: Obstetrics & Gynecology

## 2014-08-20 DIAGNOSIS — O021 Missed abortion: Secondary | ICD-10-CM | POA: Insufficient documentation

## 2014-08-20 DIAGNOSIS — G43909 Migraine, unspecified, not intractable, without status migrainosus: Secondary | ICD-10-CM | POA: Insufficient documentation

## 2014-08-20 DIAGNOSIS — Z6823 Body mass index (BMI) 23.0-23.9, adult: Secondary | ICD-10-CM | POA: Insufficient documentation

## 2014-08-20 DIAGNOSIS — E669 Obesity, unspecified: Secondary | ICD-10-CM | POA: Diagnosis not present

## 2014-08-20 HISTORY — PX: DILATION AND EVACUATION: SHX1459

## 2014-08-20 LAB — CBC
HCT: 37.4 % (ref 36.0–46.0)
Hemoglobin: 12.5 g/dL (ref 12.0–15.0)
MCH: 31.6 pg (ref 26.0–34.0)
MCHC: 33.4 g/dL (ref 30.0–36.0)
MCV: 94.7 fL (ref 78.0–100.0)
PLATELETS: 274 10*3/uL (ref 150–400)
RBC: 3.95 MIL/uL (ref 3.87–5.11)
RDW: 12.8 % (ref 11.5–15.5)
WBC: 7.2 10*3/uL (ref 4.0–10.5)

## 2014-08-20 LAB — ABO/RH: ABO/RH(D): A POS

## 2014-08-20 SURGERY — DILATION AND EVACUATION, UTERUS
Anesthesia: Monitor Anesthesia Care | Site: Uterus

## 2014-08-20 MED ORDER — SCOPOLAMINE 1 MG/3DAYS TD PT72
1.0000 | MEDICATED_PATCH | Freq: Once | TRANSDERMAL | Status: AC
  Administered 2014-08-20: 1 via TRANSDERMAL
  Administered 2014-08-20: 1.5 mg via TRANSDERMAL

## 2014-08-20 MED ORDER — SILVER NITRATE-POT NITRATE 75-25 % EX MISC
CUTANEOUS | Status: DC | PRN
  Administered 2014-08-20: 3

## 2014-08-20 MED ORDER — MIDAZOLAM HCL 2 MG/2ML IJ SOLN
INTRAMUSCULAR | Status: DC | PRN
  Administered 2014-08-20: 2 mg via INTRAVENOUS

## 2014-08-20 MED ORDER — MIDAZOLAM HCL 2 MG/2ML IJ SOLN
INTRAMUSCULAR | Status: AC
  Filled 2014-08-20: qty 2

## 2014-08-20 MED ORDER — CEFAZOLIN SODIUM-DEXTROSE 2-3 GM-% IV SOLR
INTRAVENOUS | Status: AC
  Filled 2014-08-20: qty 50

## 2014-08-20 MED ORDER — ONDANSETRON HCL 4 MG/2ML IJ SOLN
INTRAMUSCULAR | Status: AC
  Filled 2014-08-20: qty 2

## 2014-08-20 MED ORDER — CEFAZOLIN SODIUM-DEXTROSE 2-3 GM-% IV SOLR
2.0000 g | INTRAVENOUS | Status: AC
  Administered 2014-08-20: 2 g via INTRAVENOUS

## 2014-08-20 MED ORDER — 0.9 % SODIUM CHLORIDE (POUR BTL) OPTIME
TOPICAL | Status: DC | PRN
  Administered 2014-08-20: 1000 mL

## 2014-08-20 MED ORDER — PROPOFOL 10 MG/ML IV EMUL
INTRAVENOUS | Status: DC | PRN
  Administered 2014-08-20 (×2): 50 mg via INTRAVENOUS

## 2014-08-20 MED ORDER — PROPOFOL 10 MG/ML IV EMUL
INTRAVENOUS | Status: AC
  Filled 2014-08-20: qty 20

## 2014-08-20 MED ORDER — LIDOCAINE HCL 1 % IJ SOLN
INTRAMUSCULAR | Status: DC | PRN
  Administered 2014-08-20: 20 mL

## 2014-08-20 MED ORDER — LIDOCAINE HCL 1 % IJ SOLN
INTRAMUSCULAR | Status: AC
  Filled 2014-08-20: qty 20

## 2014-08-20 MED ORDER — GLYCOPYRROLATE 0.2 MG/ML IJ SOLN
INTRAMUSCULAR | Status: DC | PRN
  Administered 2014-08-20: 0.2 mg via INTRAVENOUS

## 2014-08-20 MED ORDER — FENTANYL CITRATE 0.05 MG/ML IJ SOLN
25.0000 ug | INTRAMUSCULAR | Status: DC | PRN
  Administered 2014-08-20: 50 ug via INTRAVENOUS

## 2014-08-20 MED ORDER — METOCLOPRAMIDE HCL 5 MG/ML IJ SOLN: 10.0000 mg | Freq: Once | INTRAMUSCULAR | Status: DC | PRN

## 2014-08-20 MED ORDER — LACTATED RINGERS IV SOLN
INTRAVENOUS | Status: DC
  Administered 2014-08-20 (×2): via INTRAVENOUS

## 2014-08-20 MED ORDER — LIDOCAINE HCL (CARDIAC) 20 MG/ML IV SOLN
INTRAVENOUS | Status: AC
  Filled 2014-08-20: qty 5

## 2014-08-20 MED ORDER — FENTANYL CITRATE 0.05 MG/ML IJ SOLN
INTRAMUSCULAR | Status: DC | PRN
  Administered 2014-08-20: 100 ug via INTRAVENOUS

## 2014-08-20 MED ORDER — LIDOCAINE HCL (CARDIAC) 20 MG/ML IV SOLN
INTRAVENOUS | Status: DC | PRN
  Administered 2014-08-20: 60 mg via INTRAVENOUS

## 2014-08-20 MED ORDER — MEPERIDINE HCL 25 MG/ML IJ SOLN: 6.2500 mg | INTRAMUSCULAR | Status: DC | PRN

## 2014-08-20 MED ORDER — ONDANSETRON HCL 4 MG/2ML IJ SOLN
INTRAMUSCULAR | Status: DC | PRN
  Administered 2014-08-20: 4 mg via INTRAVENOUS

## 2014-08-20 MED ORDER — FENTANYL CITRATE 0.05 MG/ML IJ SOLN
INTRAMUSCULAR | Status: AC
  Filled 2014-08-20: qty 2

## 2014-08-20 MED ORDER — SCOPOLAMINE 1 MG/3DAYS TD PT72
MEDICATED_PATCH | TRANSDERMAL | Status: AC
  Administered 2014-08-20: 1.5 mg via TRANSDERMAL
  Filled 2014-08-20: qty 1

## 2014-08-20 MED ORDER — GLYCOPYRROLATE 0.2 MG/ML IJ SOLN
INTRAMUSCULAR | Status: AC
  Filled 2014-08-20: qty 1

## 2014-08-20 MED ORDER — KETOROLAC TROMETHAMINE 30 MG/ML IJ SOLN
INTRAMUSCULAR | Status: AC
  Filled 2014-08-20: qty 1

## 2014-08-20 MED ORDER — HYDROCODONE-IBUPROFEN 5-200 MG PO TABS: 1.0000 | ORAL_TABLET | Freq: Four times a day (QID) | ORAL | Status: DC | PRN

## 2014-08-20 MED ORDER — KETOROLAC TROMETHAMINE 30 MG/ML IJ SOLN
INTRAMUSCULAR | Status: DC | PRN
  Administered 2014-08-20: 30 mg via INTRAVENOUS

## 2014-08-20 SURGICAL SUPPLY — 20 items
CATH ROBINSON RED A/P 16FR (CATHETERS) ×2 IMPLANT
CLOTH BEACON ORANGE TIMEOUT ST (SAFETY) ×2 IMPLANT
DECANTER SPIKE VIAL GLASS SM (MISCELLANEOUS) ×2 IMPLANT
GLOVE BIO SURGEON STRL SZ 6.5 (GLOVE) ×2 IMPLANT
GLOVE BIOGEL PI IND STRL 7.0 (GLOVE) ×2 IMPLANT
GLOVE BIOGEL PI IND STRL 7.5 (GLOVE) ×1 IMPLANT
GLOVE BIOGEL PI INDICATOR 7.0 (GLOVE) ×2
GLOVE BIOGEL PI INDICATOR 7.5 (GLOVE) ×1
GLOVE ECLIPSE 7.0 STRL STRAW (GLOVE) ×2 IMPLANT
GOWN STRL REUS W/TWL LRG LVL3 (GOWN DISPOSABLE) ×4 IMPLANT
KIT BERKELEY 1ST TRIMESTER 3/8 (MISCELLANEOUS) ×2 IMPLANT
PACK VAGINAL MINOR WOMEN LF (CUSTOM PROCEDURE TRAY) ×2 IMPLANT
PAD OB MATERNITY 4.3X12.25 (PERSONAL CARE ITEMS) ×2 IMPLANT
PAD PREP 24X48 CUFFED NSTRL (MISCELLANEOUS) ×2 IMPLANT
SET BERKELEY SUCTION TUBING (SUCTIONS) ×2 IMPLANT
TOWEL OR 17X24 6PK STRL BLUE (TOWEL DISPOSABLE) ×4 IMPLANT
VACURETTE 10 RIGID CVD (CANNULA) IMPLANT
VACURETTE 7MM CVD STRL WRAP (CANNULA) IMPLANT
VACURETTE 8 RIGID CVD (CANNULA) IMPLANT
VACURETTE 9 RIGID CVD (CANNULA) ×2 IMPLANT

## 2014-08-20 NOTE — Progress Notes (Signed)
Patients preliminary ABO/rh listed blood type as A neg. Dr Assunta Curtis office blood work listed patient as A pos. Blood bank notified for further clarification. Blood bank further investigated discrepancy and now states patient is A pos. States they will correct patients chart to reflect change.

## 2014-08-20 NOTE — Anesthesia Postprocedure Evaluation (Signed)
  Anesthesia Post-op Note  Patient: Kathleen Conway  Procedure(s) Performed: Procedure(s): DILATATION AND EVACUATION (N/A)  Patient Location: PACU  Anesthesia Type:MAC  Level of Consciousness: awake, alert  and oriented  Airway and Oxygen Therapy: Patient Spontanous Breathing  Post-op Pain: none  Post-op Assessment: Post-op Vital signs reviewed, Patient's Cardiovascular Status Stable, Respiratory Function Stable, Patent Airway, No signs of Nausea or vomiting and Pain level controlled  Post-op Vital Signs: Reviewed and stable  Last Vitals:  Filed Vitals:   08/20/14 1045  BP: 107/66  Pulse: 67  Temp:   Resp: 16    Complications: No apparent anesthesia complications

## 2014-08-20 NOTE — Discharge Summary (Signed)
  Physician Discharge Summary  Patient ID: Kathleen Conway MRN: [id number] DOB/AGE: 1981/07/10 33 y.o.  Admit date: 08/20/2014 Discharge date: 08/20/2014  Admission Diagnoses: Missed Abortion  Discharge Diagnoses: Missed Abortion        Active Problems:   * No active hospital problems. *   Discharged Condition: good  Hospital Course: Outpatient  Consults: None  Treatments: surgery: D+E suction  Disposition:      Medication List    TAKE these medications        aspirin-acetaminophen-caffeine 250-250-65 MG per tablet  Commonly known as:  EXCEDRIN MIGRAINE  Take 1 tablet by mouth every 6 (six) hours as needed for headache.     EPIDUO EX  Apply 1 application topically at bedtime.     hydrocodone-ibuprofen 5-200 MG per tablet  Commonly known as:  VICOPROFEN  Take 1 tablet by mouth every 6 (six) hours as needed for pain.     ibuprofen 200 MG tablet  Commonly known as:  ADVIL,MOTRIN  Take 400-600 mg by mouth every 6 (six) hours as needed for cramping.     loratadine 10 MG tablet  Commonly known as:  CLARITIN  Take 10 mg by mouth daily as needed for allergies.     multivitamin tablet  Take 1 tablet by mouth daily.     ORTHO TRI-CYCLEN (28) PO  Take by mouth.     rizatriptan 10 MG tablet  Commonly known as:  MAXALT  Take 1 tablet (10 mg total) by mouth as needed for migraine. May repeat in 2 hours if needed           Follow-up Information    Follow up with Mayson Mcneish,MARIE-LYNE, MD In 3 weeks.   Specialty:  Obstetrics and Gynecology   Contact information:   [address] [place] [id number] [phone]       Signed: Princess Bruins, MD 08/20/2014, 9:51 AM

## 2014-08-20 NOTE — Transfer of Care (Signed)
Immediate Anesthesia Transfer of Care Note  Patient: Kathleen Conway  Procedure(s) Performed: Procedure(s): DILATATION AND EVACUATION (N/A)  Patient Location: PACU  Anesthesia Type:MAC  Level of Consciousness: awake, alert  and oriented  Airway & Oxygen Therapy: Patient Spontanous Breathing and Patient connected to nasal cannula oxygen  Post-op Assessment: Report given to PACU RN, Post -op Vital signs reviewed and stable and Patient moving all extremities X 4  Post vital signs: Reviewed and stable  Complications: No apparent anesthesia complications

## 2014-08-20 NOTE — Anesthesia Preprocedure Evaluation (Addendum)
Anesthesia Evaluation  Patient identified by MRN, date of birth, ID band Patient awake    Reviewed: Allergy & Precautions, H&P , NPO status , Patient's Chart, lab work & pertinent test results  Airway Mallampati: Trach   Neck ROM: Full    Dental no notable dental hx. (+) Teeth Intact   Pulmonary neg pulmonary ROS,  breath sounds clear to auscultation  Pulmonary exam normal       Cardiovascular negative cardio ROS  Rhythm:Regular Rate:Normal     Neuro/Psych  Headaches, negative psych ROS   GI/Hepatic negative GI ROS, Neg liver ROS,   Endo/Other  negative endocrine ROS  Renal/GU negative Renal ROS  negative genitourinary   Musculoskeletal negative musculoskeletal ROS (+)   Abdominal (+) - obese,   Peds  Hematology negative hematology ROS (+)   Anesthesia Other Findings   Reproductive/Obstetrics (+) Pregnancy Missed Ab                             Anesthesia Physical Anesthesia Plan  ASA: II  Anesthesia Plan: MAC   Post-op Pain Management:    Induction: Intravenous  Airway Management Planned:   Additional Equipment:   Intra-op Plan:   Post-operative Plan:   Informed Consent: I have reviewed the patients History and Physical, chart, labs and discussed the procedure including the risks, benefits and alternatives for the proposed anesthesia with the patient or authorized representative who has indicated his/her understanding and acceptance.   Dental advisory given  Plan Discussed with: Anesthesiologist, CRNA and Surgeon  Anesthesia Plan Comments:        Anesthesia Quick Evaluation

## 2014-08-20 NOTE — Op Note (Signed)
08/20/2014  9:38 AM  PATIENT:  Kathleen Conway  33 y.o. female  PRE-OPERATIVE DIAGNOSIS:  Missed Abortion First Trimester  POST-OPERATIVE DIAGNOSIS:  Missed Abortion First Trimester  PROCEDURE:  Procedure(s): DILATATION AND EVACUATION  SURGEON:  Surgeon(s): Princess Bruins, MD  ASSISTANTS: none   ANESTHESIA:   paracervical block and MAC   PROCEDURE:  On the macular analgesia the patient is an lithotomy position. She was prepped with Betadine on the supra-pubic, vulvar and vaginal areas. The bladder was catheterized. The patient is draped as usual. The vaginal exam reveals an anteverted uterus about 8 cm small bowel no adnexal mass. No vaginal bleeding is present. The speculum is inserted in the vagina. The anterior lip of the cervix was grasped with a tenaculum. A paracervical block is done with lidocaine 1% a total of 20 cc at 4 and 8:00. Dilation of the cervix with Hegar dilators up to #29 without difficulty. We then used a #9 curved curette for suction.  Suction of the products of conception corresponding to about 8 weeks' gestation. We then used a sharp curette to gently systematically curettage the intrauterine cavity on all surfaces. We go back with the suction curette to make sure all products of conception and blood clots are removed. The uterus contracts well on the instrument.  The suction curet was removed. The tenaculum is removed.  Silver nitrate is used on the cervix to complete hemostasis. Hemostasis is adequate at all levels. The speculum is removed. The patient is brought to recovery room in good and stable status.  ESTIMATED BLOOD LOSS: 15 cc   Intake/Output Summary (Last 24 hours) at 08/20/14 2505 Last data filed at 08/20/14 0930  Gross per 24 hour  Intake      0 ml  Output     40 ml  Net    -40 ml     BLOOD ADMINISTERED:none   LOCAL MEDICATIONS USED:  Amount: 20 ml of Lidocaine 1%  SPECIMEN:  Source of Specimen:  Products of conception  DISPOSITION OF  SPECIMEN:  PATHOLOGY  COUNTS:  YES  PLAN OF CARE: Transfer to PACU   Princess Bruins MD  08/20/2014 at 9:38 am

## 2014-08-20 NOTE — Discharge Instructions (Addendum)
Dilation and Curettage or Vacuum Curettage Dilation and curettage (D&C) and vacuum curettage are minor procedures. A D&C involves stretching (dilation) the cervix and scraping (curettage) the inside lining of the womb (uterus). During a D&C, tissue is gently scraped from the inside lining of the uterus. During a vacuum curettage, the lining and tissue in the uterus are removed with the use of gentle suction.  Curettage may be performed to either diagnose or treat a problem. As a diagnostic procedure, curettage is performed to examine tissues from the uterus. A diagnostic curettage may be performed for the following symptoms:   Irregular bleeding in the uterus.   Bleeding with the development of clots.   Spotting between menstrual periods.   Prolonged menstrual periods.   Bleeding after menopause.   No menstrual period (amenorrhea).   A change in size and shape of the uterus.  As a treatment procedure, curettage may be performed for the following reasons:   Removal of an IUD (intrauterine device).   Removal of retained placenta after giving birth. Retained placenta can cause an infection or bleeding severe enough to require transfusions.   Abortion.   Miscarriage.   Removal of polyps inside the uterus.   Removal of uncommon types of noncancerous lumps (fibroids).  LET [facility] CARE PROVIDER KNOW ABOUT:   Any allergies you have.   All medicines you are taking, including vitamins, herbs, eye drops, creams, and over-the-counter medicines.   Previous problems you or members of your family have had with the use of anesthetics.   Any blood disorders you have.   Previous surgeries you have had.   Medical conditions you have. RISKS AND COMPLICATIONS  Generally, this is a safe procedure. However, as with any procedure, complications can occur. Possible complications include:  Excessive bleeding.   Infection of the uterus.   Damage to the cervix.    Development of scar tissue (adhesions) inside the uterus, later causing abnormal amounts of menstrual bleeding.   Complications from the general anesthetic, if a general anesthetic is used.   Putting a hole (perforation) in the uterus. This is rare.  BEFORE THE PROCEDURE   Eat and drink before the procedure only as directed by your health care provider.   Arrange for someone to take you home.  PROCEDURE  This procedure usually takes about 15-30 minutes.  You will be given one of the following:  A medicine that numbs the area in and around the cervix (local anesthetic).   A medicine to make you sleep through the procedure (general anesthetic).  You will lie on your back with your legs in stirrups.   A warm metal or plastic instrument (speculum) will be placed in your vagina to keep it open and to allow the health care provider to see the cervix.  There are two ways in which your cervix can be softened and dilated. These include:   Taking a medicine.   Having thin rods (laminaria) inserted into your cervix.   A curved tool (curette) will be used to scrape cells from the inside lining of the uterus. In some cases, gentle suction is applied with the curette. The curette will then be removed.  AFTER THE PROCEDURE   You will rest in the recovery area until you are stable and are ready to go home.   You may feel sick to your stomach (nauseous) or throw up (vomit) if you were given a general anesthetic.   You may have a sore throat if a tube  was placed in your throat during general anesthesia.   You may have light cramping and bleeding. This may last for 2 days to 2 weeks after the procedure.   Your uterus needs to make a new lining after the procedure. This may make your next period late. Document Released: 09/06/2005 Document Revised: 05/09/2013 Document Reviewed: 04/05/2013 [facility] Patient Information 2015 [place], [place]. This information is not intended to  replace advice given to you by your health care provider. Make sure you discuss any questions you have with your health care provider.   DISCHARGE INSTRUCTIONS: D&C / D&E The following instructions have been prepared to help you care for yourself upon your return home.   Personal hygiene:  Use sanitary pads for vaginal drainage, not tampons.  Shower the day after your procedure.  NO tub baths, pools or Jacuzzis for 2-3 weeks.  Wipe front to back after using the bathroom.  Activity and limitations:  Do NOT drive or operate any equipment for 24 hours. The effects of anesthesia are still present and drowsiness may result.  Do NOT rest in bed all day.  Walking is encouraged.  Walk up and down stairs slowly.  You may resume your normal activity in one to two days or as indicated by your physician.  Sexual activity: NO intercourse for at least 2 weeks after the procedure, or as indicated by your physician.  Diet: Eat a light meal as desired this evening. You may resume your usual diet tomorrow.  Return to work: You may resume your work activities in one to two days or as indicated by your doctor.  What to expect after your surgery: Expect to have vaginal bleeding/discharge for 2-3 days and spotting for up to 10 days. It is not unusual to have soreness for up to 1-2 weeks. You may have a slight burning sensation when you urinate for the first day. Mild cramps may continue for a couple of days. You may have a regular period in 2-6 weeks.  NO IBUPROFEN PRODUCTS (MOTRIN, ADVIL) OR ALEVE UNTIL 3:30PM TODAY.   Call your doctor for any of the following:  Excessive vaginal bleeding, saturating and changing one pad every hour.  Inability to urinate 6 hours after discharge from hospital.  Pain not relieved by pain medication.  Fever of 100.4 F or greater.  Unusual vaginal discharge or odor.   Call for an appointment:    Patients signature: ______________________  Nurses  signature ________________________  Support person's signature_______________________

## 2014-08-20 NOTE — H&P (Signed)
Kathleen Conway is an 33 y.o. female G1P0   RP:  MAB for D+E  Pertinent Gynecological History: Bleeding: 1st trimester bleeding Contraception: none Blood transfusions: none Sexually transmitted diseases: no past history Last mammogram: None  Last pap: normal  OB History: G1P0   Menstrual History  Patient's last menstrual period was 05/24/2014 (approximate).    Past Medical History  Diagnosis Date  . Migraines   . Allergy     Past Surgical History  Procedure Laterality Date  . Wisdom tooth extraction      Family History  Problem Relation Age of Onset  . Cancer Mother 14    breast  . Cancer Father 45    prostate  . Stroke Maternal Grandfather   . Cancer Maternal Grandfather     colon  . Cancer Maternal Aunt     Breast and lung  . Cancer Maternal Uncle     Lung  . Cancer Paternal Grandfather     Social History:  reports that she has never smoked. She has never used smokeless tobacco. She reports that she drinks alcohol. She reports that she does not use illicit drugs.  Allergies:  Allergies  Allergen Reactions  . Topamax Nausea And Vomiting    No prescriptions prior to admission    ROS  Height 5\' 1"  (1.549 m), weight 56.7 kg (125 lb), last menstrual period 05/24/2014. Physical Exam  No results found for this or any previous visit (from the past 24 hour(s)).  No results found.  Assessment/Plan: 1st trimester MAB for D+E.  Surgery and risks reviewed.  Kathleen Conway,MARIE-LYNE 08/20/2014, 8:08 AM

## 2014-08-21 ENCOUNTER — Encounter (HOSPITAL_COMMUNITY): Payer: Self-pay | Admitting: Obstetrics & Gynecology

## 2014-08-29 ENCOUNTER — Ambulatory Visit (INDEPENDENT_AMBULATORY_CARE_PROVIDER_SITE_OTHER): Payer: 59 | Admitting: Family Medicine

## 2014-08-29 ENCOUNTER — Encounter: Payer: Self-pay | Admitting: Family Medicine

## 2014-08-29 VITALS — BP 106/76 | HR 86 | Temp 98.6°F | Resp 16 | Ht 61.25 in | Wt 123.0 lb

## 2014-08-29 DIAGNOSIS — F439 Reaction to severe stress, unspecified: Secondary | ICD-10-CM

## 2014-08-29 DIAGNOSIS — Z566 Other physical and mental strain related to work: Secondary | ICD-10-CM

## 2014-08-29 DIAGNOSIS — Z029 Encounter for administrative examinations, unspecified: Secondary | ICD-10-CM

## 2014-08-29 DIAGNOSIS — G43009 Migraine without aura, not intractable, without status migrainosus: Secondary | ICD-10-CM

## 2014-08-29 DIAGNOSIS — F430 Acute stress reaction: Secondary | ICD-10-CM

## 2014-08-29 DIAGNOSIS — O039 Complete or unspecified spontaneous abortion without complication: Secondary | ICD-10-CM

## 2014-08-29 MED ORDER — CLONAZEPAM 0.5 MG PO TABS: ORAL_TABLET | ORAL | Status: DC

## 2014-08-29 NOTE — Patient Instructions (Signed)
I have prescribed Clonazepam 0.5 mg for help with sleep or anxiety. You can take 1/2 tablet at bedtime; if that does not help you get at least 6-7 hours of sleep, then take 1 whole tablet.  If you find that 1/2 tablet is adequate for calming you but not making you sleepy, you can take 1/2 tablet during the day if needed for anxiety.  Contact me if you have questions,concerns or need anything else.  I would like to see you again in 3-4 months.

## 2014-08-29 NOTE — Progress Notes (Signed)
Subjective:    Patient ID: Kathleen Conway, female    DOB: [birth year], 33 y.o.   MRN: [id number]  HPI  This 33 y.o. AA female is here for follow-up re: migraine HA disorder. She recently had intractable migraine resulting from tremendous stress in her life; she suffered a miscarriage at 8 weeks and underwent D&E in 08/20/2014. Pt has significant job stress and had returned to school to pursue an advanced degree. She has 2 sons and her husband is very supportive. She has a few close friends that she can confide in and who support her. Pt's mother is deceased but she has maternal aunts who want to help her through this difficult time.  Pt speaks very highly of nursing staff at the hospital- professional, compassionate and attentive.  She had a migraine last week that lasted several days; multiple doses of Maxalt were required to relieve HA. Last dose was on Saturday ( 5 days ago). She is back at work after missing 3 days (covered by [employer]). She needs new FMLA paperwork completed. Appetite is fair but she is not sleeping well.   Patient Active Problem List   Diagnosis Date Noted  . Work-related stress 06/08/2014  . Rhinitis, allergic 06/08/2014  . Chronic pain of left knee 05/16/2012  . Migraine headache 12/02/2011    Prior to Admission medications   Medication Sig Start Date End Date Taking? Authorizing Provider  Adapalene-Benzoyl Peroxide (EPIDUO EX) Apply 1 application topically at bedtime.   Yes Historical Provider, MD  aspirin-acetaminophen-caffeine (EXCEDRIN MIGRAINE) [phone] MG per tablet Take 1 tablet by mouth every 6 (six) hours as needed for headache.    Yes Historical Provider, MD  ibuprofen (ADVIL,MOTRIN) 200 MG tablet Take 400-600 mg by mouth every 6 (six) hours as needed for cramping.   Yes Historical Provider, MD  loratadine (CLARITIN) 10 MG tablet Take 10 mg by mouth daily as needed for allergies.   Yes Historical Provider, MD  Multiple Vitamin (MULTIVITAMIN) tablet Take 1  tablet by mouth daily.   Yes Historical Provider, MD  rizatriptan (MAXALT) 10 MG tablet Take 1 tablet (10 mg total) by mouth as needed for migraine. May repeat in 2 hours if needed 06/07/14 06/07/15 Yes Barton Fanny, MD  Norgestim-Eth Radene Journey Triphasic [facility] TRI-CYCLEN, 28, PO) Take by mouth.    Historical Provider, MD    History   Social History  . Marital Status: Married    Spouse Name: N/A    Number of Children: N/A  . Years of Education: college   Occupational History  . customer service representative    Social History Main Topics  . Smoking status: Never Smoker   . Smokeless tobacco: Never Used  . Alcohol Use: Yes     Comment: socially-wine but none with pregnancy  . Drug Use: No  . Sexual Activity: Not on file     Comment: approx [redacted] wks gestation   Other Topics Concern  . Not on file   Social History Narrative   FAMILY Hx reviewed.   Review of Systems  Constitutional: Positive for appetite change and fatigue. Negative for fever.  Respiratory: Negative.   Cardiovascular: Negative.   Gastrointestinal: Negative.   Skin: Negative.   Neurological: Positive for headaches. Negative for dizziness, syncope, speech difficulty, weakness and numbness.  Psychiatric/Behavioral: Positive for sleep disturbance and dysphoric mood.       Objective:   Physical Exam  Constitutional: She is oriented to person, place, and time. She appears well-developed and well-nourished.  No distress.  HENT:  Head: Normocephalic and atraumatic.  Right Ear: External ear normal.  Left Ear: External ear normal.  Mouth/Throat: Oropharynx is clear and moist.  Eyes: Conjunctivae and EOM are normal. Pupils are equal, round, and reactive to light. No scleral icterus.  Cardiovascular: Normal rate and regular rhythm.   Pulmonary/Chest: Effort normal. No respiratory distress.  Musculoskeletal: Normal range of motion.  Neurological: She is alert and oriented to person, place, and time. No cranial  nerve deficit. Coordination normal.  Skin: Skin is warm and dry. She is not diaphoretic.  Psychiatric: Her speech is normal and behavior is normal. Judgment and thought content normal. Her mood appears not anxious. Her affect is labile. Her affect is not inappropriate. Cognition and memory are normal.  Very tearful but attentive and conversant. Speech pattern and thought content are normal. Judgement is sound. PHQ-9 score= 11.  Nursing note and vitals reviewed.      Assessment & Plan:  Migraine without aura and without status migrainosus, not intractable- Encouraged better nutrition and hydration. Maxalt effective; refills available.  Work-related stress- Pt will be contacted when [facility] paperwork is ready for pick-up.  Stress disorder, acute- Trial of Clonazepam for sleep and for use during the day if needed.  Meds ordered this encounter  Medications  . clonazePAM (KLONOPIN) 0.5 MG tablet    Sig: Take 1/2 - 1 tablet at bedtime as needed for sleep.    Dispense:  20 tablet    Refill:  1

## 2014-09-03 ENCOUNTER — Encounter: Payer: Self-pay | Admitting: Family Medicine

## 2014-09-03 ENCOUNTER — Telehealth: Payer: Self-pay | Admitting: Family Medicine

## 2014-09-03 NOTE — Telephone Encounter (Signed)
Patient dropped off FMLA forms on 08/29/2014 and they were sent to Dr. Leward Quan via interoffice mail on 09/03/2014. Patient needs FMLA for migraines. Please send back to FMLA/Disabilities when complete. Patient wants forms faxed and mailed when finished.   [phone]

## 2014-09-10 ENCOUNTER — Telehealth: Payer: Self-pay | Admitting: Family Medicine

## 2014-09-10 NOTE — Telephone Encounter (Signed)
Papers were sent via interoffice mail on 09/03/2014. Patient called to check the status of these forms since the deadline is quickly approaching. I have printed a blank copy of forms and have filled in some sections of FMLA paperwork and took these to 104 myself instead of sending through office mail. Can these be finished today since patient dropped them off on 08/29/2014?

## 2014-09-10 NOTE — Telephone Encounter (Signed)
I have located the forms, they have been completed, Delana Meyer will come get them today. Thanks

## 2014-09-10 NOTE — Telephone Encounter (Signed)
Notified patient that her paperwork has been completed. She will come to 104 to pick forms. I will fax and scan completed forms.

## 2014-09-11 ENCOUNTER — Encounter: Payer: Self-pay | Admitting: Family Medicine

## 2014-09-20 NOTE — L&D Delivery Note (Signed)
Delivery Note At 10:42 PM a healthy female was delivered via Vaginal, Spontaneous Delivery (Presentation: ; Occiput Anterior).  APGAR: 8, 10; weight 8 lb 9 oz (3884 g).   Placenta status: Intact, Spontaneous.  Cord: 3 vessels with the following complications: None.  Cord pH: N/A  Anesthesia: Epidural  Episiotomy: None Lacerations: 2nd degree Suture Repair: 2.0 3.0 vicryl vicryl rapide Est. Blood Loss (mL):  300  Mom to postpartum.  Baby to Couplet care / Skin to Skin.  Latarsha Zani,MARIE-LYNE 07/25/2015, 11:11 PM

## 2014-12-27 LAB — OB RESULTS CONSOLE HIV ANTIBODY (ROUTINE TESTING): HIV: NONREACTIVE

## 2014-12-27 LAB — OB RESULTS CONSOLE RPR: RPR: NONREACTIVE

## 2014-12-27 LAB — OB RESULTS CONSOLE ANTIBODY SCREEN: ANTIBODY SCREEN: NEGATIVE

## 2014-12-27 LAB — OB RESULTS CONSOLE HEPATITIS B SURFACE ANTIGEN: Hepatitis B Surface Ag: NEGATIVE

## 2014-12-27 LAB — OB RESULTS CONSOLE ABO/RH: RH TYPE: POSITIVE

## 2014-12-27 LAB — OB RESULTS CONSOLE RUBELLA ANTIBODY, IGM: RUBELLA: IMMUNE

## 2015-01-06 LAB — OB RESULTS CONSOLE GC/CHLAMYDIA
Chlamydia: NEGATIVE
Gonorrhea: NEGATIVE

## 2015-06-25 LAB — OB RESULTS CONSOLE GBS: GBS: NEGATIVE

## 2015-07-15 ENCOUNTER — Other Ambulatory Visit: Payer: Self-pay | Admitting: Obstetrics & Gynecology

## 2015-07-17 ENCOUNTER — Telehealth (HOSPITAL_COMMUNITY): Payer: Self-pay | Admitting: *Deleted

## 2015-07-17 ENCOUNTER — Encounter (HOSPITAL_COMMUNITY): Payer: Self-pay | Admitting: *Deleted

## 2015-07-17 NOTE — Telephone Encounter (Signed)
Preadmission screen  

## 2015-07-21 ENCOUNTER — Inpatient Hospital Stay (HOSPITAL_COMMUNITY): Admission: AD | Admit: 2015-07-21 | Payer: Self-pay | Source: Ambulatory Visit | Admitting: Obstetrics & Gynecology

## 2015-07-25 ENCOUNTER — Inpatient Hospital Stay (HOSPITAL_COMMUNITY)
Admission: RE | Admit: 2015-07-25 | Discharge: 2015-07-27 | DRG: 775 | Disposition: A | Discharge status: Home / Self Care | Payer: 59 | Source: Ambulatory Visit | Attending: Obstetrics & Gynecology | Admitting: Obstetrics & Gynecology

## 2015-07-25 ENCOUNTER — Inpatient Hospital Stay (HOSPITAL_COMMUNITY): Payer: 59 | Admitting: Anesthesiology

## 2015-07-25 ENCOUNTER — Encounter (HOSPITAL_COMMUNITY): Payer: Self-pay

## 2015-07-25 DIAGNOSIS — D509 Iron deficiency anemia, unspecified: Secondary | ICD-10-CM | POA: Diagnosis present

## 2015-07-25 DIAGNOSIS — O9081 Anemia of the puerperium: Secondary | ICD-10-CM | POA: Diagnosis present

## 2015-07-25 DIAGNOSIS — K219 Gastro-esophageal reflux disease without esophagitis: Secondary | ICD-10-CM | POA: Diagnosis present

## 2015-07-25 DIAGNOSIS — Z3A40 40 weeks gestation of pregnancy: Secondary | ICD-10-CM | POA: Diagnosis not present

## 2015-07-25 DIAGNOSIS — O9962 Diseases of the digestive system complicating childbirth: Secondary | ICD-10-CM | POA: Diagnosis present

## 2015-07-25 DIAGNOSIS — O99019 Anemia complicating pregnancy, unspecified trimester: Secondary | ICD-10-CM | POA: Diagnosis present

## 2015-07-25 DIAGNOSIS — O480 Post-term pregnancy: Principal | ICD-10-CM | POA: Diagnosis present

## 2015-07-25 DIAGNOSIS — Z823 Family history of stroke: Secondary | ICD-10-CM | POA: Diagnosis not present

## 2015-07-25 DIAGNOSIS — Z8249 Family history of ischemic heart disease and other diseases of the circulatory system: Secondary | ICD-10-CM | POA: Diagnosis not present

## 2015-07-25 DIAGNOSIS — D62 Acute posthemorrhagic anemia: Secondary | ICD-10-CM | POA: Diagnosis not present

## 2015-07-25 LAB — CBC
HEMATOCRIT: 33.8 % — AB (ref 36.0–46.0)
Hemoglobin: 11.1 g/dL — ABNORMAL LOW (ref 12.0–15.0)
MCH: 27.9 pg (ref 26.0–34.0)
MCHC: 32.8 g/dL (ref 30.0–36.0)
MCV: 84.9 fL (ref 78.0–100.0)
Platelets: 317 10*3/uL (ref 150–400)
RBC: 3.98 MIL/uL (ref 3.87–5.11)
RDW: 14.9 % (ref 11.5–15.5)
WBC: 8.9 10*3/uL (ref 4.0–10.5)

## 2015-07-25 LAB — TYPE AND SCREEN
ABO/RH(D): A POS
Antibody Screen: NEGATIVE

## 2015-07-25 LAB — RPR: RPR Ser Ql: NONREACTIVE

## 2015-07-25 MED ORDER — PHENYLEPHRINE 40 MCG/ML (10ML) SYRINGE FOR IV PUSH (FOR BLOOD PRESSURE SUPPORT)
80.0000 ug | PREFILLED_SYRINGE | INTRAVENOUS | Status: DC | PRN
  Filled 2015-07-25: qty 2
  Filled 2015-07-25: qty 20

## 2015-07-25 MED ORDER — CITRIC ACID-SODIUM CITRATE 334-500 MG/5ML PO SOLN
ORAL | Status: AC
  Filled 2015-07-25: qty 15

## 2015-07-25 MED ORDER — TERBUTALINE SULFATE 1 MG/ML IJ SOLN
0.2500 mg | Freq: Once | INTRAMUSCULAR | Status: DC | PRN
  Filled 2015-07-25: qty 1

## 2015-07-25 MED ORDER — CITRIC ACID-SODIUM CITRATE 334-500 MG/5ML PO SOLN: 30.0000 mL | ORAL | Status: DC | PRN

## 2015-07-25 MED ORDER — OXYCODONE-ACETAMINOPHEN 5-325 MG PO TABS
1.0000 | ORAL_TABLET | ORAL | Status: DC | PRN
  Administered 2015-07-26: 1 via ORAL
  Filled 2015-07-25: qty 1

## 2015-07-25 MED ORDER — EPHEDRINE 5 MG/ML INJ
10.0000 mg | INTRAVENOUS | Status: DC | PRN
  Filled 2015-07-25: qty 2

## 2015-07-25 MED ORDER — FENTANYL 2.5 MCG/ML BUPIVACAINE 1/10 % EPIDURAL INFUSION (WH - ANES)
14.0000 mL/h | INTRAMUSCULAR | Status: DC | PRN
  Administered 2015-07-25: 11.5 mL/h via EPIDURAL
  Administered 2015-07-25: 14 mL/h via EPIDURAL
  Filled 2015-07-25 (×3): qty 125

## 2015-07-25 MED ORDER — OXYCODONE-ACETAMINOPHEN 5-325 MG PO TABS: 2.0000 | ORAL_TABLET | ORAL | Status: DC | PRN

## 2015-07-25 MED ORDER — ACETAMINOPHEN 325 MG PO TABS: 650.0000 mg | ORAL_TABLET | ORAL | Status: DC | PRN

## 2015-07-25 MED ORDER — ONDANSETRON HCL 4 MG/2ML IJ SOLN: 4.0000 mg | Freq: Four times a day (QID) | INTRAMUSCULAR | Status: DC | PRN

## 2015-07-25 MED ORDER — OXYTOCIN 40 UNITS IN LACTATED RINGERS INFUSION - SIMPLE MED: 62.5000 mL/h | INTRAVENOUS | Status: DC

## 2015-07-25 MED ORDER — LIDOCAINE HCL (PF) 1 % IJ SOLN
INTRAMUSCULAR | Status: DC | PRN
  Administered 2015-07-25: 4 mL via EPIDURAL
  Administered 2015-07-25: 3 mL via EPIDURAL

## 2015-07-25 MED ORDER — OXYTOCIN BOLUS FROM INFUSION: 500.0000 mL | INTRAVENOUS | Status: DC

## 2015-07-25 MED ORDER — DIPHENHYDRAMINE HCL 50 MG/ML IJ SOLN: 12.5000 mg | INTRAMUSCULAR | Status: DC | PRN

## 2015-07-25 MED ORDER — FLEET ENEMA 7-19 GM/118ML RE ENEM: 1.0000 | ENEMA | RECTAL | Status: DC | PRN

## 2015-07-25 MED ORDER — OXYTOCIN 40 UNITS IN LACTATED RINGERS INFUSION - SIMPLE MED
1.0000 m[IU]/min | INTRAVENOUS | Status: DC
  Administered 2015-07-25: 2 m[IU]/min via INTRAVENOUS
  Filled 2015-07-25: qty 1000

## 2015-07-25 MED ORDER — LACTATED RINGERS IV SOLN
INTRAVENOUS | Status: DC
  Administered 2015-07-25 (×2): via INTRAVENOUS

## 2015-07-25 MED ORDER — LACTATED RINGERS IV SOLN
500.0000 mL | INTRAVENOUS | Status: DC | PRN
  Administered 2015-07-25: 500 mL via INTRAVENOUS

## 2015-07-25 MED ORDER — LIDOCAINE HCL (PF) 1 % IJ SOLN
30.0000 mL | INTRAMUSCULAR | Status: DC | PRN
  Administered 2015-07-25: 30 mL via SUBCUTANEOUS
  Filled 2015-07-25: qty 30

## 2015-07-25 NOTE — H&P (Signed)
Kathleen Conway is a 34 y.o. female G37P0011 [redacted]w[redacted]d presenting for Post dates induction of labor.  HPP/HPI:  Normal pregnancy.  Informaseq wnl.  [place] anato wnl.  3 hr GTT wnl.  Resolved placenta previa at 34 wks.  GBS neg.  Last [place] EFW 89%, AFI wnl.  OB History    Gravida Para Term Preterm AB TAB SAB Ectopic Multiple Living   4 2   1  1   1      Past Medical History  Diagnosis Date  . Migraines   . Allergy   . Vaginal Pap smear, abnormal   . Hx of varicella   . Carpal tunnel syndrome during pregnancy    Past Surgical History  Procedure Laterality Date  . Wisdom tooth extraction    . Dilation and evacuation N/A 08/20/2014    Procedure: DILATATION AND EVACUATION;  Surgeon: Princess Bruins, MD;  Location: [facility];  Service: Gynecology;  Laterality: N/A;   Family History: family history includes Cancer in her maternal aunt, maternal grandfather, maternal uncle, and paternal grandfather; Cancer (age of onset: 7) in her mother; Cancer (age of onset: 7) in her father; Down syndrome in her paternal aunt; Heart attack in her paternal grandmother; Stroke in her maternal grandfather. Social History:  reports that she has never smoked. She has never used smokeless tobacco. She reports that she drinks alcohol. She reports that she does not use illicit drugs.  Allergies  Allergen Reactions  . Topamax Nausea And Vomiting    Dilation: 5 Effacement (%): 80 Station: -2 Exam by:: Hadasah Brugger  AROM AF clear.  1 prolonged variable deceleration this am, probably cord compression.  Before and since then, FHR monitoring Cat 1 with good variability, accelerations present, no other deceleration with base line in 130's.  UCs q3 min, rarely q4 min.  Blood pressure 114/65, pulse 83, temperature 98.6 F (37 C), temperature source Oral, resp. rate 20, height 5\' 1"  (1.549 m), weight 157 lb (71.215 kg), last menstrual period 10/14/2014, SpO2 100 %, unknown if currently breastfeeding. Exam Physical  Exam  HPP:  Patient Active Problem List   Diagnosis Date Noted  . Indication for care in labor or delivery 07/25/2015  . Miscarriage 08/29/2014  . Work-related stress 06/08/2014  . Rhinitis, allergic 06/08/2014  . Chronic pain of left knee 05/16/2012  . Migraine headache 12/02/2011    Prenatal labs: ABO, Rh: --/--/A POS (11/04 0750) Antibody: NEG (11/04 0750) Rubella: !Error! RPR: Non Reactive (11/04 0750)  HBsAg: Negative (04/08 0000)  HIV: Non-reactive (04/08 0000)  Genetic testing: Informaseq wnl, female fetus [place] anato:  Wnl.  Placenta previa, resolved at 34 wks. 1 hr GTT:  Abnormal.  3 hr GTT wnl. GBS: Negative (10/05 0000)  [place] 36+ wks EFW 89%, AFI wnl.   Assessment/Plan: Induction post dates in active labor with FHR monitoring Cat 1.  Expectant management towards probable Vaginal delivery.   Gaylord Seydel,MARIE-LYNE 07/25/2015, 5:11 PM

## 2015-07-25 NOTE — Anesthesia Procedure Notes (Addendum)
Epidural Patient location during procedure: OB Start time: 07/25/2015 9:23 AM  Staffing Anesthesiologist: Josephine Igo Performed by: anesthesiologist   Preanesthetic Checklist Completed: patient identified, site marked, surgical consent, pre-op evaluation, timeout performed, IV checked, risks and benefits discussed and monitors and equipment checked  Epidural Patient position: sitting Prep: site prepped and draped and DuraPrep Patient monitoring: continuous pulse ox and blood pressure Approach: midline Location: L3-L4 Injection technique: LOR air  Needle:  Needle type: Tuohy  Needle gauge: 17 G Needle length: 9 cm and 9 Needle insertion depth: 4 cm Catheter type: closed end flexible Catheter size: 19 Gauge Catheter at skin depth: 9 cm Test dose: negative and Other  Assessment Events: blood not aspirated, injection not painful, no injection resistance, negative IV test and no paresthesia  Additional Notes Patient identified. Risks and benefits discussed including failed block, incomplete  Pain control, post dural puncture headache, nerve damage, paralysis, blood pressure Changes, nausea, vomiting, reactions to medications-both toxic and allergic and post Partum back pain. All questions were answered. Patient expressed understanding and wished to proceed. Sterile technique was used throughout procedure. Epidural site was Dressed with sterile barrier dressing. No paresthesias, signs of intravascular injection Or signs of intrathecal spread were encountered.  Patient was more comfortable after the epidural was dosed. Please see RN's note for documentation of vital signs and FHR which are stable.

## 2015-07-25 NOTE — Anesthesia Preprocedure Evaluation (Signed)
Anesthesia Evaluation  Patient identified by MRN, date of birth, ID band Patient awake    Reviewed: Allergy & Precautions, NPO status , Patient's Chart, lab work & pertinent test results  Airway Mallampati: II  TM Distance: >3 FB Neck ROM: Full    Dental no notable dental hx. (+) Teeth Intact   Pulmonary    Pulmonary exam normal breath sounds clear to auscultation       Cardiovascular negative cardio ROS Normal cardiovascular exam Rhythm:Regular Rate:Normal     Neuro/Psych  Headaches,  Neuromuscular disease negative psych ROS   GI/Hepatic Neg liver ROS, GERD  ,  Endo/Other  negative endocrine ROS  Renal/GU negative Renal ROS  negative genitourinary   Musculoskeletal   Abdominal   Peds  Hematology negative hematology ROS (+)   Anesthesia Other Findings   Reproductive/Obstetrics (+) Pregnancy                             Anesthesia Physical Anesthesia Plan  ASA: II  Anesthesia Plan: Epidural   Post-op Pain Management:    Induction:   Airway Management Planned: Natural Airway  Additional Equipment:   Intra-op Plan:   Post-operative Plan:   Informed Consent: I have reviewed the patients History and Physical, chart, labs and discussed the procedure including the risks, benefits and alternatives for the proposed anesthesia with the patient or authorized representative who has indicated his/her understanding and acceptance.     Plan Discussed with: Anesthesiologist, CRNA and Surgeon  Anesthesia Plan Comments:         Anesthesia Quick Evaluation

## 2015-07-26 ENCOUNTER — Encounter (HOSPITAL_COMMUNITY): Payer: Self-pay

## 2015-07-26 LAB — CBC
HCT: 30.8 % — ABNORMAL LOW (ref 36.0–46.0)
HEMOGLOBIN: 9.9 g/dL — AB (ref 12.0–15.0)
MCH: 27.3 pg (ref 26.0–34.0)
MCHC: 32.1 g/dL (ref 30.0–36.0)
MCV: 85.1 fL (ref 78.0–100.0)
PLATELETS: 251 10*3/uL (ref 150–400)
RBC: 3.62 MIL/uL — ABNORMAL LOW (ref 3.87–5.11)
RDW: 14.9 % (ref 11.5–15.5)
WBC: 14.5 10*3/uL — ABNORMAL HIGH (ref 4.0–10.5)

## 2015-07-26 MED ORDER — SIMETHICONE 80 MG PO CHEW: 80.0000 mg | CHEWABLE_TABLET | ORAL | Status: DC | PRN

## 2015-07-26 MED ORDER — ONDANSETRON HCL 4 MG PO TABS: 4.0000 mg | ORAL_TABLET | ORAL | Status: DC | PRN

## 2015-07-26 MED ORDER — SENNOSIDES-DOCUSATE SODIUM 8.6-50 MG PO TABS
2.0000 | ORAL_TABLET | ORAL | Status: DC
  Administered 2015-07-27: 2 via ORAL
  Filled 2015-07-26: qty 2

## 2015-07-26 MED ORDER — WITCH HAZEL-GLYCERIN EX PADS: 1.0000 "application " | MEDICATED_PAD | CUTANEOUS | Status: DC | PRN

## 2015-07-26 MED ORDER — LANOLIN HYDROUS EX OINT: TOPICAL_OINTMENT | CUTANEOUS | Status: DC | PRN

## 2015-07-26 MED ORDER — OXYCODONE-ACETAMINOPHEN 5-325 MG PO TABS: 1.0000 | ORAL_TABLET | ORAL | Status: DC | PRN

## 2015-07-26 MED ORDER — ZOLPIDEM TARTRATE 5 MG PO TABS: 5.0000 mg | ORAL_TABLET | Freq: Every evening | ORAL | Status: DC | PRN

## 2015-07-26 MED ORDER — DIBUCAINE 1 % RE OINT: 1.0000 "application " | TOPICAL_OINTMENT | RECTAL | Status: DC | PRN

## 2015-07-26 MED ORDER — POLYSACCHARIDE IRON COMPLEX 150 MG PO CAPS
150.0000 mg | ORAL_CAPSULE | Freq: Every day | ORAL | Status: DC
  Administered 2015-07-26 – 2015-07-27 (×2): 150 mg via ORAL
  Filled 2015-07-26 (×2): qty 1

## 2015-07-26 MED ORDER — TETANUS-DIPHTH-ACELL PERTUSSIS 5-2.5-18.5 LF-MCG/0.5 IM SUSP: 0.5000 mL | Freq: Once | INTRAMUSCULAR | Status: DC

## 2015-07-26 MED ORDER — BENZOCAINE-MENTHOL 20-0.5 % EX AERO
1.0000 "application " | INHALATION_SPRAY | CUTANEOUS | Status: DC | PRN
  Administered 2015-07-26: 1 via TOPICAL
  Filled 2015-07-26: qty 56

## 2015-07-26 MED ORDER — IBUPROFEN 600 MG PO TABS
600.0000 mg | ORAL_TABLET | Freq: Four times a day (QID) | ORAL | Status: DC
  Administered 2015-07-26 – 2015-07-27 (×6): 600 mg via ORAL
  Filled 2015-07-26 (×6): qty 1

## 2015-07-26 MED ORDER — DIPHENHYDRAMINE HCL 25 MG PO CAPS: 25.0000 mg | ORAL_CAPSULE | Freq: Four times a day (QID) | ORAL | Status: DC | PRN

## 2015-07-26 MED ORDER — OXYCODONE-ACETAMINOPHEN 5-325 MG PO TABS: 2.0000 | ORAL_TABLET | ORAL | Status: DC | PRN

## 2015-07-26 MED ORDER — ACETAMINOPHEN 325 MG PO TABS: 650.0000 mg | ORAL_TABLET | ORAL | Status: DC | PRN

## 2015-07-26 MED ORDER — OXYTOCIN 40 UNITS IN LACTATED RINGERS INFUSION - SIMPLE MED: 62.5000 mL/h | INTRAVENOUS | Status: DC | PRN

## 2015-07-26 MED ORDER — PRENATAL MULTIVITAMIN CH
1.0000 | ORAL_TABLET | Freq: Every day | ORAL | Status: DC
  Administered 2015-07-26: 1 via ORAL
  Filled 2015-07-26: qty 1

## 2015-07-26 MED ORDER — ONDANSETRON HCL 4 MG/2ML IJ SOLN: 4.0000 mg | INTRAMUSCULAR | Status: DC | PRN

## 2015-07-26 NOTE — Anesthesia Postprocedure Evaluation (Signed)
  Anesthesia Post-op Note  Patient: Kathleen Conway  Procedure(s) Performed: * No procedures listed *  Patient Location: Mother/Baby  Anesthesia Type:Epidural  Level of Consciousness: awake, alert , oriented and patient cooperative  Airway and Oxygen Therapy: Patient Spontanous Breathing  Post-op Pain: none  Post-op Assessment: Post-op Vital signs reviewed, Patient's Cardiovascular Status Stable, Respiratory Function Stable, Patent Airway, No headache, No backache and Patient able to bend at knees              Post-op Vital Signs: Reviewed and stable  Last Vitals:  Filed Vitals:   07/26/15 0658  BP: 105/63  Pulse: 64  Temp: 36.4 C  Resp: 16    Complications: No apparent anesthesia complications

## 2015-07-26 NOTE — Lactation Note (Signed)
This note was copied from the chart of Kathleen Mckenzie Quadros. Lactation Consultation Note  P3, Ex BF for 1 year with each sibling. Mother states she knows how to hand express but has not viewed drops yet. Discussed basics and cluster feeding. Mom encouraged to feed baby 8-12 times/24 hours and with feeding cues.  Mom encouraged to feed baby 8-12 times/24 hours and with feeding cues.  Provided mother with a hand pump and pillows.  Suggest she call for further assistance. Mom made aware of O/P services, breastfeeding support groups, community resources, and our phone # for post-discharge questions.     Patient Name: Kathleen Conway [id number] Date: 07/26/2015 Reason for consult: Initial assessment   Maternal Data Has patient been taught Hand Expression?: Yes (states she knows how to hand express) Does the patient have breastfeeding experience prior to this delivery?: Yes  Feeding Feeding Type: Breast Fed Length of feed: 30 min  LATCH Score/Interventions                      Lactation Tools Discussed/Used     Consult Status Consult Status: Follow-up Date: 07/27/15 Follow-up type: In-patient    Vivianne Master [facility] 07/26/2015, 1:52 PM

## 2015-07-26 NOTE — Progress Notes (Signed)
PPD #1- SVD  Subjective:   Reports feeling well, some cramping with BF Tolerating po/ No nausea or vomiting Bleeding is light Pain controlled with Motrin Up ad lib / ambulatory / voiding without problems Newborn: breastfeeding    Objective:   VS:  VS:  Filed Vitals:   07/26/15 0147 07/26/15 0303 07/26/15 0658 07/26/15 0911  BP: 124/70 105/53 105/63 107/62  Pulse: 79 82 64 81  Temp: 98.9 F (37.2 C) 98.6 F (37 C) 97.6 F (36.4 C) 98.2 F (36.8 C)  TempSrc: Oral Oral Oral Oral  Resp: 16 16 16 18   Height:      Weight:      SpO2:        LABS:  Recent Labs  07/25/15 0750 07/26/15 0535  WBC 8.9 14.5*  HGB 11.1* 9.9*  PLT 317 251   Blood type: --/--/A POS (11/04 0750) Rubella: Immune (04/08 0000)   I&O: Intake/Output      11/04 0701 - 11/05 0700 11/05 0701 - 11/06 0700   Urine (mL/kg/hr) 2100    Blood 300    Total Output 2400     Net -2400            Physical Exam: Alert and oriented x3 Abdomen: soft, non-tender, non-distended  Fundus: firm, non-tender, U-2 Perineum: Well approximated, no significant erythema, edema, or drainage; healing well. Lochia: small Extremities: No edema, no calf pain or tenderness   Assessment:  PPD #1 G4P1012/ S/P:spontaneous vaginal, 2nd degree laceration IDA with compounding ABL anemia  Doing well   Plan: Start Fe po Continue routine post partum orders Anticipate D/C home tomorrow   Julianne Handler, N MSN, CNM 07/26/2015, 12:31 PM

## 2015-07-26 NOTE — Progress Notes (Signed)
CLINICAL SOCIAL WORK MATERNAL/CHILD NOTE  Patient Details  Name: Kathleen Conway MRN: [id number] Date of Birth: 07/25/2017  Date: 07/26/2015  Clinical Social Worker Initiating Note: Persephonie Hegwood, LCSWDate/ Time Initiated: 07/26/15/1230   Child's Name: Kathleen Conway   Legal Guardian:  (parents)   Need for Interpreter: None   Date of Referral: 07/25/15   Reason for Referral: Other (Comment)   Referral Source: [facility]   Address: [address] [place], [place] [address]  Phone number:  [phone])   Household Members: Spouse, Minor Children   Natural Supports (not living in the home): Extended Family, Immediate Family   Professional Supports:None   Employment: (Both parents employed)   Type of Work:     Education:     Printmaker   Other Resources:     Cultural/Religious Considerations Which May Impact Care:none noted  Strengths: Ability to meet basic needs , Home prepared for child    Risk Factors/Current Problems:     Cognitive State: Alert , Able to Concentrate    Mood/Affect: Happy    CSW Assessment: Acknowledged order for social work consult to assess mother's hx of anxiety. Met with mother who was pleasant and receptive to social work. She is married and have two other dependents ages 1 and 76. MOB acknowledged hx of anxiety stating that she was treated for anxiety after she had a miscarriage. MOB states that the treatment was brief and she took medication for a shot time to help her sleep. Informed that she got pregnant shortly after the miscarriage. She denies any currently symptoms and states that she will seek treatment if the symptoms return. She spoke of how excited she is about the birth of her baby. No acute social concerns noted or reported at this time. 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  CSW Plan/Description:    Discussed  signs/symptoms of PP Depression and available resources No further intervention required No barriers to discharge  Manoj Enriquez J, LCSW 07/26/2015, 5:41 PM

## 2015-07-27 DIAGNOSIS — D509 Iron deficiency anemia, unspecified: Secondary | ICD-10-CM | POA: Diagnosis present

## 2015-07-27 DIAGNOSIS — O99019 Anemia complicating pregnancy, unspecified trimester: Secondary | ICD-10-CM

## 2015-07-27 DIAGNOSIS — D62 Acute posthemorrhagic anemia: Secondary | ICD-10-CM | POA: Diagnosis not present

## 2015-07-27 MED ORDER — POLYSACCHARIDE IRON COMPLEX 150 MG PO CAPS: 150.0000 mg | ORAL_CAPSULE | Freq: Every day | ORAL | Status: DC

## 2015-07-27 MED ORDER — OXYCODONE-ACETAMINOPHEN 5-325 MG PO TABS: 1.0000 | ORAL_TABLET | ORAL | Status: DC | PRN

## 2015-07-27 MED ORDER — IBUPROFEN 600 MG PO TABS: 600.0000 mg | ORAL_TABLET | Freq: Four times a day (QID) | ORAL | Status: DC

## 2015-07-27 NOTE — Discharge Summary (Signed)
Obstetric Discharge Summary Reason for Admission: onset of labor and 40.4 weeks Prenatal Course: post dates, previa -resolved Intrapartum Procedures: spontaneous vaginal delivery and Pitocin, AROM, epidural Postpartum Procedures: none Complications-Operative and Postpartum: 2nd degree perineal laceration HEMOGLOBIN  Date Value Ref Range Status  07/26/2015 9.9* 12.0 - 15.0 g/dL Final   HCT  Date Value Ref Range Status  07/26/2015 30.8* 36.0 - 46.0 % Final    Physical Exam:  General: alert, cooperative and no distress Lochia: appropriate Uterine Fundus: firm Incision: healing well, no significant drainage, no dehiscence, no significant erythema DVT Evaluation: No evidence of DVT seen on physical exam. Negative Homan's sign. No cords or calf tenderness. No significant calf/ankle edema.  Discharge Diagnoses: Term Pregnancy-delivered, IDA with compounding ABL anemia  Discharge Information: Date: 07/27/2015 Activity: pelvic rest Diet: routine Medications: PNV, Ibuprofen, Iron and Percocet Condition: stable Instructions: refer to practice specific booklet Discharge to: home Follow-up Information    Follow up with LAVOIE,MARIE-LYNE, MD. Schedule an appointment as soon as possible for a visit in 6 weeks.   Specialty:  Obstetrics and Gynecology   Contact information:   [place] [address] [phone]       Newborn Data: Live born female on [birth year] Birth Weight: 8 lb 9 oz (3884 g) APGAR: 8, 10  Home with mother.  Nicholaos Schippers, Regan, N 07/27/2015, 9:31 AM

## 2015-07-27 NOTE — Lactation Note (Signed)
This note was copied from the chart of Kathleen Conway. Lactation Consultation Note  Mother latched baby in football position.  Baby latched briefly and fell asleep. Mother's nipples pink and tender.  Practiced achieving a deeper latch with mother to help w/ sore nipples and provided mother w/ comfort gels. Reviewed engorgement care and monitoring voids/stools. No other concerns or problems noted.   Patient Name: Kathleen Conway HKFEX'M Date: 07/27/2015 Reason for consult: Follow-up assessment   Maternal Data    Feeding Feeding Type: Breast Fed Length of feed: 40 min  LATCH Score/Interventions Latch: Grasps breast easily, tongue down, lips flanged, rhythmical sucking.  Audible Swallowing: A few with stimulation  Type of Nipple: Everted at rest and after stimulation  Comfort (Breast/Nipple): Filling, red/small blisters or bruises, mild/mod discomfort  Problem noted: Mild/Moderate discomfort Interventions (Mild/moderate discomfort): Comfort gels;Hand expression  Hold (Positioning): No assistance needed to correctly position infant at breast.  LATCH Score: 8  Lactation Tools Discussed/Used     Consult Status Consult Status: Complete    Carlye Grippe 07/27/2015, 9:39 AM

## 2015-07-27 NOTE — Progress Notes (Signed)
PPD #2- SVD  Subjective:   Reports feeling well, some cramping with BF Tolerating po/ No nausea or vomiting Bleeding is light Pain controlled with Motrin and Percocet Up ad lib / ambulatory / voiding without problems Newborn: breastfeeding    Objective:   VS: VS:  Filed Vitals:   07/26/15 0658 07/26/15 0911 07/26/15 1800 07/27/15 0601  BP: 105/63 107/62 115/67 114/66  Pulse: 64 81 76 68  Temp: 97.6 F (36.4 C) 98.2 F (36.8 C) 98.7 F (37.1 C) 98.5 F (36.9 C)  TempSrc: Oral Oral Oral Oral  Resp: 16 18 20 18   Height:      Weight:      SpO2:   100%     LABS:  Recent Labs  07/25/15 0750 07/26/15 0535  WBC 8.9 14.5*  HGB 11.1* 9.9*  PLT 317 251   Blood type: --/--/A POS (11/04 0750) Rubella: Immune (04/08 0000)                I&O: Intake/Output      11/05 0701 - 11/06 0700 11/06 0701 - 11/07 0700   Urine (mL/kg/hr)     Blood     Total Output       Net              Physical Exam: Alert and oriented X3 Abdomen: soft, non-tender, non-distended  Fundus: firm, non-tender, U-3 Perineum: Well approximated, no significant erythema, edema, or drainage; healing well. Lochia: small Extremities: No edema, no calf pain or tenderness   Assessment: PPD #2  G4P1012/ S/P:spontaneous vaginal, 2nd degree laceration IDA with compounding ABL anemia  Doing well - stable for discharge home  Plan: Discharge home RX's:  Ibuprofen 600mg  po Q 6 hrs prn pain #30 Refill x 0 Niferex 150mg  po QD #30 Refill x 1 Percocet 5/325 1 po Q 4 hrs prn pain #30 Refill x 0 Follow up in 6 wks at [facility] for postpartum visit [facility] booklet given    Julianne Handler, N MSN, CNM 07/27/2015, 9:17 AM

## 2015-08-04 ENCOUNTER — Encounter (HOSPITAL_COMMUNITY): Payer: Self-pay

## 2016-07-08 ENCOUNTER — Ambulatory Visit (INDEPENDENT_AMBULATORY_CARE_PROVIDER_SITE_OTHER): Payer: Commercial Managed Care - HMO | Admitting: Physician Assistant

## 2016-07-08 VITALS — BP 118/80 | HR 80 | Temp 98.2°F | Resp 17 | Ht 61.25 in | Wt 130.0 lb

## 2016-07-08 DIAGNOSIS — G43019 Migraine without aura, intractable, without status migrainosus: Secondary | ICD-10-CM | POA: Diagnosis not present

## 2016-07-08 DIAGNOSIS — Z0000 Encounter for general adult medical examination without abnormal findings: Secondary | ICD-10-CM

## 2016-07-08 DIAGNOSIS — F419 Anxiety disorder, unspecified: Secondary | ICD-10-CM | POA: Diagnosis not present

## 2016-07-08 DIAGNOSIS — Z23 Encounter for immunization: Secondary | ICD-10-CM | POA: Diagnosis not present

## 2016-07-08 DIAGNOSIS — R350 Frequency of micturition: Secondary | ICD-10-CM | POA: Diagnosis not present

## 2016-07-08 LAB — LIPID PANEL
Cholesterol: 194 mg/dL (ref 125–200)
HDL: 60 mg/dL (ref >=46)
LDL Cholesterol: 123 mg/dL (ref <130)
Total CHOL/HDL Ratio: 3.2 Ratio (ref <=5.0)
Triglycerides: 53 mg/dL (ref <150)
VLDL: 11 mg/dL (ref <30)

## 2016-07-08 LAB — CBC
HCT: 41.4 % (ref 35.0–45.0)
Hemoglobin: 13.5 g/dL (ref 11.7–15.5)
MCH: 30.1 pg (ref 27.0–33.0)
MCHC: 32.6 g/dL (ref 32.0–36.0)
MCV: 92.2 fL (ref 80.0–100.0)
MPV: 10 fL (ref 7.5–12.5)
Platelets: 290 10*3/uL (ref 140–400)
RBC: 4.49 MIL/uL (ref 3.80–5.10)
RDW: 13.6 % (ref 11.0–15.0)
WBC: 5.1 10*3/uL (ref 3.8–10.8)

## 2016-07-08 LAB — BASIC METABOLIC PANEL
BUN: 12 mg/dL (ref 7–25)
Chloride: 104 mmol/L (ref 98–110)
Potassium: 4.2 mmol/L (ref 3.5–5.3)
Sodium: 139 mmol/L (ref 135–146)

## 2016-07-08 LAB — POCT URINALYSIS DIP (MANUAL ENTRY)
Bilirubin, UA: NEGATIVE
Glucose, UA: NEGATIVE
Ketones, POC UA: NEGATIVE
Leukocytes, UA: NEGATIVE
Nitrite, UA: NEGATIVE
Protein Ur, POC: NEGATIVE
Spec Grav, UA: 1.015
Urobilinogen, UA: 0.2
pH, UA: 6

## 2016-07-08 LAB — BASIC METABOLIC PANEL WITH GFR
CO2: 21 mmol/L (ref 20–31)
Calcium: 9.6 mg/dL (ref 8.6–10.2)
Creat: 0.87 mg/dL (ref 0.50–1.10)
Glucose, Bld: 75 mg/dL (ref 65–99)

## 2016-07-08 LAB — POC MICROSCOPIC URINALYSIS (UMFC): Mucus: ABSENT

## 2016-07-08 MED ORDER — CITALOPRAM HYDROBROMIDE 10 MG PO TABS: 10.0000 mg | ORAL_TABLET | Freq: Every day | ORAL | 1 refills | Status: DC

## 2016-07-08 MED ORDER — RIZATRIPTAN BENZOATE 10 MG PO TABS: 10.0000 mg | ORAL_TABLET | ORAL | 5 refills | Status: DC | PRN

## 2016-07-08 NOTE — Progress Notes (Signed)
[facility] [address], [place] [address] [phone]  Date:  07/08/2016   Name:  Kathleen Conway   DOB:  12/20/1980   MRN:  [id number]  PCP:  Ellsworth Lennox, MD    Chief Complaint: Annual Exam   History of Present Illness:  This is a 35 y.o. female with PMH migraines, work-related stress, who is presenting for CPE. Currently nursing 42 year old daughter.   Flu shot - getting today.  Mammogram - Dr. Pola Corn, GYN recommendation, history of breast mass.   Complaints: Migraines and anxiety.  Migraines - Taking Maxalt. She is breast feeding, so doesn't want to take it. Sept to March are bad. Stress is biggest trigger. She is out of work so not as bad. Sleep helps. Worse with noise, stress, lights. Lasts half a day. Worse in the morning.   She is looking to change her work. Headaches have improved since she has been out of work for her child. She is out of her medication.   Anxiety - Present constantly, doesn't sleep well. Especially now she has children, Has gotten really bad. Zoloft, didn't take it due to breast feeding, as she was nervous this would affect her baby.   LMP: Currently nursing Contraception: Pills Last pap: 2016 done by Dr. Pola Corn, GYN Sexual history: Active, one partner.  Immunizations: Flu shot today  Dentist: Regularly Eye: No corrective lenses.  Diet/Exercise: Walks 2-3 x week for an hour at a time.  Fam hx: Cancer: mother, father, grandfather and grandmother.  Tobacco/alcohol/substance use: Alcohol with special events.   Review of Systems:  Review of Systems  Constitutional: Negative for chills, diaphoresis, fatigue and fever.  HENT: Negative for congestion, postnasal drip, rhinorrhea, sinus pressure, sneezing and sore throat.   Respiratory: Negative for cough, chest tightness, shortness of breath and wheezing.   Cardiovascular: Negative for chest pain and palpitations.  Gastrointestinal: Negative for abdominal pain,  diarrhea, nausea and vomiting.  Neurological: Positive for headaches. Negative for weakness and light-headedness.  Psychiatric/Behavioral: The patient is nervous/anxious.     Patient Active Problem List   Diagnosis Date Noted  . Iron deficiency anemia of pregnancy 07/27/2015  . Acute blood loss anemia 07/27/2015  . Postpartum state 07/26/2015  . Postpartum care following vaginal delivery (11/4) 07/26/2015  . Indication for care in labor or delivery 07/25/2015  . Miscarriage 08/29/2014  . Work-related stress 06/08/2014  . Rhinitis, allergic 06/08/2014  . Chronic pain of left knee 05/16/2012  . Migraine headache 12/02/2011    Prior to Admission medications   Medication Sig Start Date End Date Taking? Authorizing Provider  cetirizine (ZYRTEC) 10 MG tablet Take 10 mg by mouth daily as needed for allergies.   Yes Historical Provider, MD  ibuprofen (ADVIL,MOTRIN) 600 MG tablet Take 1 tablet (600 mg total) by mouth every 6 (six) hours. 07/27/15  Yes Julianne Handler, CNM  norethindrone (MICRONOR,CAMILA,ERRIN) 0.35 MG tablet Take 1 tablet by mouth daily.   Yes Historical Provider, MD    Allergies  Allergen Reactions  . Topamax Nausea And Vomiting    Past Surgical History:  Procedure Laterality Date  . DILATION AND EVACUATION N/A 08/20/2014   Procedure: DILATATION AND EVACUATION;  Surgeon: Princess Bruins, MD;  Location: [facility];  Service: Gynecology;  Laterality: N/A;  . WISDOM TOOTH EXTRACTION      Social History  Substance Use Topics  . Smoking status: Never Smoker  . Smokeless tobacco: Never Used  . Alcohol use Yes  Comment: socially-wine but none with pregnancy    Family History  Problem Relation Age of Onset  . Cancer Mother 29    breast  . Cancer Father 4    prostate  . Stroke Maternal Grandfather   . Cancer Maternal Grandfather     colon  . Cancer Paternal Grandfather   . Heart attack Paternal Grandmother   . Cancer Maternal Aunt     Breast and lung  .  Cancer Maternal Uncle     Lung  . Down syndrome Paternal Aunt     Medication list has been reviewed and updated.  Physical Examination:  Physical Exam  Constitutional: She is oriented to person, place, and time. She appears well-developed and well-nourished. No distress.  HENT:  Head: Normocephalic and atraumatic.  Mouth/Throat: Oropharynx is clear and moist.  Eyes: Conjunctivae and EOM are normal. Pupils are equal, round, and reactive to light.  Cardiovascular: Normal rate, regular rhythm and normal heart sounds.   No murmur heard. Pulmonary/Chest: Effort normal and breath sounds normal. She has no wheezes.  Abdominal: Soft. She exhibits no distension. There is no tenderness.  Musculoskeletal: Normal range of motion.  Neurological: She is alert and oriented to person, place, and time. She has normal reflexes.  Skin: Skin is warm and dry.  Psychiatric: She has a normal mood and affect. Her behavior is normal. Judgment and thought content normal.  Vitals reviewed.   BP 118/80 (BP Location: Right Arm, Patient Position: Sitting, Cuff Size: Normal)   Pulse 80   Temp 98.2 F (36.8 C) (Oral)   Resp 17   Ht 5' 1.25" (1.556 m)   Wt 130 lb (59 kg)   SpO2 99%   Breastfeeding? Yes   BMI 24.36 kg/m   Results for orders placed or performed in visit on 07/08/16  POCT urinalysis dipstick  Result Value Ref Range   Color, UA yellow yellow   Clarity, UA clear clear   Glucose, UA negative negative   Bilirubin, UA negative negative   Ketones, POC UA negative negative   Spec Grav, UA 1.015    Blood, UA trace-intact (A) negative   pH, UA 6.0    Protein Ur, POC negative negative   Urobilinogen, UA 0.2    Nitrite, UA Negative Negative   Leukocytes, UA Negative Negative  POCT Microscopic Urinalysis ([facility])  Result Value Ref Range   WBC,UR,HPF,POC None None WBC/hpf   RBC,UR,HPF,POC None None RBC/hpf   Bacteria Few (A) None, Too numerous to count   Mucus Absent Absent   Epithelial  Cells, UR Per Microscopy Few (A) None, Too numerous to count cells/hpf    Assessment and Plan: 1. Annual physical exam - Basic metabolic panel - CBC - Lipid panel - Labs pending, will release when available.   2. Intractable migraine without aura and without status migrainosus - rizatriptan (MAXALT) 10 MG tablet; Take 1 tablet (10 mg total) by mouth as needed for migraine. May repeat in 2 hours if needed  Dispense: 10 tablet; Refill: 5  3. Urinary frequency - POCT urinalysis dipstick - POCT Microscopic Urinalysis ([facility]) - Urine culture  4. Anxiety - citalopram (CELEXA) 10 MG tablet; Take 1 tablet (10 mg total) by mouth daily.  Dispense: 90 tablet; Refill: 1 - Discussed with patient she needs to return in one month for recheck. Referral to [facility] as she feels her symptoms are worsening and would like to talk to a professional.    5. Need for prophylactic vaccination and inoculation  against influenza - Flu Vaccine QUAD 36+ mos IM   Mercer Pod, PA-C  [facility] 07/08/2016 11:52 AM

## 2016-07-08 NOTE — Patient Instructions (Addendum)
Ask Dr. Pola Corn about urinary symptoms. Possibly bladder prolapse.   Please come back in 4-6 weeks so we can make sure you are feeling well on Celexa.   Please call [facility] [place] (female) Insurance risk surveyor (female), Tyler Deis Address: [address], [place], [place] [address]  Phone: [phone]  Thank you for coming in today. I hope you feel we met your needs.  Feel free to call [facility] if you have any questions or further requests.  Please consider signing up for MyChart if you do not already have it, as this is a great way to communicate with me.  Best,  Whitney McVey, PA-C  IF you received an x-ray today, you will receive an invoice from [facility]. Please contact [facility] at [phone] with questions or concerns regarding your invoice.   IF you received labwork today, you will receive an invoice from [employer]. Please contact [facility] at [phone] with questions or concerns regarding your invoice.   Our billing staff will not be able to assist you with questions regarding bills from these companies.  You will be contacted with the lab results as soon as they are available. The fastest way to get your results is to activate your My Chart account. Instructions are located on the last page of this paperwork. If you have not heard from [place] regarding the results in 2 weeks, please contact this office.    We recommend that you schedule a mammogram for breast cancer screening. Typically, you do not need a referral to do this. Please contact a local imaging center to schedule your mammogram.  [facility] - [phone]  *ask for the Radiology Department [facility] - [phone] or [phone]  [facility] - [phone] [place] [phone] [facility] - [phone]  *ask for the [facility] -  [phone]  *ask for the Radiology Department [facility] - [phone]  *ask for the [facility] - [phone]

## 2016-07-09 LAB — URINE CULTURE

## 2016-11-05 DIAGNOSIS — Z6823 Body mass index (BMI) 23.0-23.9, adult: Secondary | ICD-10-CM | POA: Diagnosis not present

## 2016-11-05 DIAGNOSIS — N870 Mild cervical dysplasia: Secondary | ICD-10-CM | POA: Diagnosis not present

## 2016-11-05 DIAGNOSIS — Z01419 Encounter for gynecological examination (general) (routine) without abnormal findings: Secondary | ICD-10-CM | POA: Diagnosis not present

## 2016-12-01 ENCOUNTER — Ambulatory Visit (INDEPENDENT_AMBULATORY_CARE_PROVIDER_SITE_OTHER): Payer: Commercial Managed Care - HMO | Admitting: Physician Assistant

## 2016-12-01 ENCOUNTER — Telehealth: Payer: 59 | Admitting: Nurse Practitioner

## 2016-12-01 VITALS — BP 102/68 | HR 105 | Temp 98.0°F | Resp 18 | Ht 61.25 in | Wt 128.0 lb

## 2016-12-01 DIAGNOSIS — R05 Cough: Secondary | ICD-10-CM | POA: Diagnosis not present

## 2016-12-01 DIAGNOSIS — R058 Other specified cough: Secondary | ICD-10-CM

## 2016-12-01 DIAGNOSIS — R059 Cough, unspecified: Secondary | ICD-10-CM

## 2016-12-01 MED ORDER — CETIRIZINE HCL 10 MG PO TABS: 10.0000 mg | ORAL_TABLET | Freq: Every day | ORAL | 11 refills | Status: DC

## 2016-12-01 MED ORDER — BENZONATATE 100 MG PO CAPS: 100.0000 mg | ORAL_CAPSULE | Freq: Three times a day (TID) | ORAL | 0 refills | Status: DC | PRN

## 2016-12-01 MED ORDER — FLUTICASONE PROPIONATE 50 MCG/ACT NA SUSP: 2.0000 | Freq: Every day | NASAL | 6 refills | Status: DC

## 2016-12-01 MED ORDER — DEXTROMETHORPHAN HBR 15 MG/5ML PO SYRP: 10.0000 mL | ORAL_SOLUTION | Freq: Four times a day (QID) | ORAL | 0 refills | Status: DC | PRN

## 2016-12-01 NOTE — Progress Notes (Signed)
Based on what you shared with me it looks like you have a serious condition that should be evaluated in a face to face office visit.  NOTE: Even if you have entered your credit card information for this eVisit, you will not be charged.   If you are having a true medical emergency please call 911.  If you need an urgent face to face visit, [facility] has four urgent care centers for your convenience.  If you need care fast and have a high deductible or no insurance consider:   [email or web address]  [phone]  [address], [address] [place], [place] [address] 8 am to 8 pm Monday-Friday 10 am to 4 pm Saturday-Sunday   The following sites will take your  insurance:    . [facility]  [phone] Get Driving Directions Find a Provider at this Location  [address] [place], [place] [address] . 10 am to 8 pm Monday-Friday . 12 pm to 8 pm Saturday-Sunday   . [facility]  [phone] Get Driving Directions Find a Provider at this Location  [address], [address] [place], [place] [address] . 8 am to 8 pm Monday-Friday . 9 am to 6 pm Saturday . 11 am to 6 pm Sunday   .  [facility]  [phone] Get Driving Directions  [address] [place], [place] [address] . 8 am to 8 pm Monday-Friday . 8 am to 4 pm Saturday-Sunday   Your e-visit answers were reviewed by a board certified advanced clinical practitioner to complete your personal care plan.  Thank you for using e-Visits.  

## 2016-12-01 NOTE — Progress Notes (Signed)
Kathleen Conway  MRN: [id number] DOB: [birth year]  PCP: Gelene Mink Colon Rueth, PA-C  Subjective:  Pt is a 36 year old female who presents to clinic for cough x 1 month.  She was sick about one month ago and has had a lingering cough since that time.  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  Intermittent throughout the day and is worse after she eats.   She is sleeping well at night - cough does not wake her up. Cough is better today than it was one month ago. Endorses some post nasal drip. She has tried allergy medication, cough drops and nexium - not helping.   Denies fever, chills, chest tightness, SOB, wheezing, abdominal pain, regurgitation symptoms, burning in chest, sneezing.   She is still breastfeeding.   Review of Systems  Constitutional: Negative for chills, diaphoresis, fatigue and fever.  HENT: Positive for postnasal drip. Negative for congestion, rhinorrhea, sinus pressure, sneezing and sore throat.   Respiratory: Positive for cough. Negative for chest tightness, shortness of breath and wheezing.   Cardiovascular: Negative for chest pain and palpitations.  Gastrointestinal: Negative for abdominal pain, diarrhea, nausea and vomiting.  Neurological: Negative for weakness, light-headedness and headaches.    Patient Active Problem List   Diagnosis Date Noted  . Work-related stress 06/08/2014  . Rhinitis, allergic 06/08/2014  . Chronic pain of left knee 05/16/2012  . Migraine headache 12/02/2011    Current Outpatient Prescriptions on File Prior to Visit  Medication Sig Dispense Refill  . cetirizine (ZYRTEC) 10 MG tablet Take 10 mg by mouth daily as needed for allergies.    Marland Kitchen ibuprofen (ADVIL,MOTRIN) 600 MG tablet Take 1 tablet (600 mg total) by mouth every 6 (six) hours. 30 tablet 0  . rizatriptan (MAXALT) 10 MG tablet Take 1 tablet (10 mg total) by mouth as needed for migraine. May repeat in 2 hours if needed 10 tablet 5  .  citalopram (CELEXA) 10 MG tablet Take 1 tablet (10 mg total) by mouth daily. (Patient not taking: Reported on 12/01/2016) 90 tablet 1  . norethindrone (MICRONOR,CAMILA,ERRIN) 0.35 MG tablet Take 1 tablet by mouth daily.     No current facility-administered medications on file prior to visit.     Allergies  Allergen Reactions  . Topamax Nausea And Vomiting     Objective:  BP 102/68 (BP Location: Right Arm, Patient Position: Sitting, Cuff Size: Small)   Pulse (!) 105   Temp 98 F (36.7 C) (Oral)   Resp 18   Ht 5' 1.25" (1.556 m)   Wt 128 lb (58.1 kg)   SpO2 100%   BMI 23.99 kg/m   Physical Exam  Constitutional: She is oriented to person, place, and time and well-developed, well-nourished, and in no distress. No distress.  HENT:  Nose: Mucosal edema and rhinorrhea present. Right sinus exhibits no maxillary sinus tenderness and no frontal sinus tenderness. Left sinus exhibits no maxillary sinus tenderness and no frontal sinus tenderness.  Mouth/Throat: Mucous membranes are normal. Posterior oropharyngeal edema present. No oropharyngeal exudate or posterior oropharyngeal erythema.  Cardiovascular: Normal rate, regular rhythm and normal heart sounds.   Pulmonary/Chest: Effort normal and breath sounds normal. No respiratory distress. She has no wheezes. She has no rales.  Neurological: She is alert and oriented to person, place, and time. GCS score is 15.  Skin: Skin is warm and dry.  Psychiatric: Mood, memory, affect and judgment normal.  Vitals reviewed.   Assessment and Plan :  1. Upper airway cough syndrome 2. Cough - fluticasone (FLONASE) 50 MCG/ACT nasal spray; Place 2 sprays into both nostrils daily.  Dispense: 16 g; Refill: 6 - benzonatate (TESSALON) 100 MG capsule; Take 1-2 capsules (100-200 mg total) by mouth 3 (three) times daily as needed for cough.  Dispense: 40 capsule; Refill: 0 - cetirizine (ZYRTEC) 10 MG tablet; Take 1 tablet (10 mg total) by mouth daily.  Dispense:  30 tablet; Refill: 11 - dextromethorphan 15 MG/5ML syrup; Take 10 mLs (30 mg total) by mouth 4 (four) times daily as needed for cough.  Dispense: 120 mL; Refill: 0 - Suspect upper airway cough syndrome. Supportive care encouraged: Push fluids, rest. RTC in 2-3 weeks if no improvement. She agrees with plan.    Mercer Pod, PA-C  [facility] 12/01/2016 5:51 PM

## 2016-12-01 NOTE — Patient Instructions (Addendum)
  Please come back if you are not better in 2-3 weeks. Stay well hydrated - drink 2-3 liters of water/day. Drink warm tea with honey and lemon.  Thank you for coming in today. I hope you feel we met your needs.  Feel free to call [facility] if you have any questions or further requests.  Please consider signing up for MyChart if you do not already have it, as this is a great way to communicate with me.  Best,  Whitney McVey, PA-C   IF you received an x-ray today, you will receive an invoice from [facility]. Please contact [facility] at [phone] with questions or concerns regarding your invoice.   IF you received labwork today, you will receive an invoice from [facility]. Please contact [facility] at [phone] with questions or concerns regarding your invoice.   Our billing staff will not be able to assist you with questions regarding bills from these companies.  You will be contacted with the lab results as soon as they are available. The fastest way to get your results is to activate your My Chart account. Instructions are located on the last page of this paperwork. If you have not heard from [place] regarding the results in 2 weeks, please contact this office.

## 2016-12-29 ENCOUNTER — Ambulatory Visit (INDEPENDENT_AMBULATORY_CARE_PROVIDER_SITE_OTHER): Payer: Commercial Managed Care - HMO | Admitting: Emergency Medicine

## 2016-12-29 VITALS — BP 149/78 | HR 70 | Temp 98.1°F | Resp 18 | Ht 61.25 in | Wt 134.0 lb

## 2016-12-29 DIAGNOSIS — J302 Other seasonal allergic rhinitis: Secondary | ICD-10-CM

## 2016-12-29 DIAGNOSIS — R059 Cough, unspecified: Secondary | ICD-10-CM

## 2016-12-29 DIAGNOSIS — R05 Cough: Secondary | ICD-10-CM | POA: Diagnosis not present

## 2016-12-29 DIAGNOSIS — J069 Acute upper respiratory infection, unspecified: Secondary | ICD-10-CM | POA: Diagnosis not present

## 2016-12-29 DIAGNOSIS — R053 Chronic cough: Secondary | ICD-10-CM | POA: Insufficient documentation

## 2016-12-29 MED ORDER — MONTELUKAST SODIUM 10 MG PO TABS: 10.0000 mg | ORAL_TABLET | Freq: Every day | ORAL | 3 refills | Status: DC

## 2016-12-29 NOTE — Progress Notes (Signed)
Kathleen Conway 36 y.o.   Chief Complaint  Patient presents with  . Follow-up    cough     HISTORY OF PRESENT ILLNESS: This is a 36 y.o. female here for follow up of cough; doing better but cough not completely gone; feels there is an allergic component to her condition.  HPI   Prior to Admission medications   Medication Sig Start Date End Date Taking? Authorizing Provider  cetirizine (ZYRTEC) 10 MG tablet Take 10 mg by mouth daily as needed for allergies.   Yes Historical Provider, MD  cetirizine (ZYRTEC) 10 MG tablet Take 1 tablet (10 mg total) by mouth daily. 12/01/16  Yes Elizabeth Whitney McVey, PA-C  fluticasone (FLONASE) 50 MCG/ACT nasal spray Place 2 sprays into both nostrils daily. 12/01/16  Yes Elizabeth Whitney McVey, PA-C  ibuprofen (ADVIL,MOTRIN) 600 MG tablet Take 1 tablet (600 mg total) by mouth every 6 (six) hours. 07/27/15  Yes Julianne Handler, CNM  Norethindrone Acetate-Ethinyl Estrad-FE (BLISOVI 24 FE) 1-20 MG-MCG(24) tablet Take 1 tablet by mouth daily.   Yes Historical Provider, MD  rizatriptan (MAXALT) 10 MG tablet Take 1 tablet (10 mg total) by mouth as needed for migraine. May repeat in 2 hours if needed 07/08/16 08/27/17 Yes [employer] McVey, PA-C  benzonatate (TESSALON) 100 MG capsule Take 1-2 capsules (100-200 mg total) by mouth 3 (three) times daily as needed for cough. Patient not taking: Reported on 12/29/2016 12/01/16   Gelene Mink McVey, PA-C  dextromethorphan 15 MG/5ML syrup Take 10 mLs (30 mg total) by mouth 4 (four) times daily as needed for cough. Patient not taking: Reported on 12/29/2016 12/01/16   Gelene Mink McVey, PA-C    Allergies  Allergen Reactions  . Topamax Nausea And Vomiting    Patient Active Problem List   Diagnosis Date Noted  . Work-related stress 06/08/2014  . Rhinitis, allergic 06/08/2014  . Chronic pain of left knee 05/16/2012  . Migraine headache 12/02/2011    Past Medical History:  Diagnosis Date  .  Allergy   . Anxiety   . Carpal tunnel syndrome during pregnancy   . Hx of varicella   . Migraines   . Postpartum care following vaginal delivery (11/4) 07/26/2015  . Vaginal Pap smear, abnormal     Past Surgical History:  Procedure Laterality Date  . DILATION AND EVACUATION N/A 08/20/2014   Procedure: DILATATION AND EVACUATION;  Surgeon: Princess Bruins, MD;  Location: [facility];  Service: Gynecology;  Laterality: N/A;  . WISDOM TOOTH EXTRACTION      Social History   Social History  . Marital status: Married    Spouse name: N/A  . Number of children: N/A  . Years of education: college   Occupational History  . customer service representative    Social History Main Topics  . Smoking status: Never Smoker  . Smokeless tobacco: Never Used  . Alcohol use Yes     Comment: socially-wine but none with pregnancy  . Drug use: No  . Sexual activity: Not on file     Comment: approx [redacted] wks gestation   Other Topics Concern  . Not on file   Social History Narrative  . No narrative on file    Family History  Problem Relation Age of Onset  . Cancer Mother 90    breast  . Cancer Father 10    prostate  . Stroke Maternal Grandfather   . Cancer Maternal Grandfather     colon  . Cancer Paternal Grandfather   .  Heart attack Paternal Grandmother   . Cancer Maternal Aunt     Breast and lung  . Cancer Maternal Uncle     Lung  . Down syndrome Paternal Aunt      Review of Systems  Constitutional: Negative.  Negative for chills and fever.  HENT: Negative.  Negative for congestion and sore throat.   Eyes: Negative for blurred vision, double vision, discharge and redness.  Respiratory: Positive for cough. Negative for sputum production, shortness of breath and wheezing.   Cardiovascular: Negative for chest pain and palpitations.  Gastrointestinal: Negative for abdominal pain, diarrhea, nausea and vomiting.  Genitourinary: Negative for dysuria and hematuria.  Skin: Negative for  rash.  Neurological: Negative for dizziness, sensory change, focal weakness and headaches.  Endo/Heme/Allergies: Negative.   All other systems reviewed and are negative.   Vitals:   12/29/16 1750  BP: (!) 149/78  Pulse: 70  Resp: 18  Temp: 98.1 F (36.7 C)    Physical Exam  Constitutional: She is oriented to person, place, and time. She appears well-developed and well-nourished.  HENT:  Head: Normocephalic and atraumatic.  Nose: Nose normal.  Mouth/Throat: Oropharynx is clear and moist.  Eyes: Conjunctivae and EOM are normal. Pupils are equal, round, and reactive to light.  Neck: Normal range of motion. Neck supple. No JVD present.  Cardiovascular: Normal rate, regular rhythm and normal heart sounds.   Pulmonary/Chest: Effort normal and breath sounds normal.  Abdominal: Soft. She exhibits no distension. There is no tenderness.  Musculoskeletal: Normal range of motion.  Neurological: She is alert and oriented to person, place, and time. No sensory deficit. She exhibits normal muscle tone.  Skin: Skin is warm and dry. Capillary refill takes less than 2 seconds. No rash noted.  Psychiatric: She has a normal mood and affect. Her behavior is normal.  Vitals reviewed.    ASSESSMENT & PLAN: Kathleen Conway was seen today for follow-up.  Diagnoses and all orders for this visit:  Cough  Acute upper respiratory infection Comments: improving  Seasonal allergic rhinitis, unspecified chronicity, unspecified trigger  Other orders -     montelukast (SINGULAIR) 10 MG tablet; Take 1 tablet (10 mg total) by mouth at bedtime.    Patient Instructions  Cough, Adult A cough helps to clear your throat and lungs. A cough may last only 2-3 weeks (acute), or it may last longer than 8 weeks (chronic). Many different things can cause a cough. A cough may be a sign of an illness or another medical condition. Follow these instructions at home:  Pay attention to any changes in your cough.  Take  medicines only as told by your doctor.  If you were prescribed an antibiotic medicine, take it as told by your doctor. Do not stop taking it even if you start to feel better.  Talk with your doctor before you try using a cough medicine.  Drink enough fluid to keep your pee (urine) clear or pale yellow.  If the air is dry, use a cold steam vaporizer or humidifier in your home.  Stay away from things that make you cough at work or at home.  If your cough is worse at night, try using extra pillows to raise your head up higher while you sleep.  Do not smoke, and try not to be around smoke. If you need help quitting, ask your doctor.  Do not have caffeine.  Do not drink alcohol.  Rest as needed. Contact a doctor if:  You have new problems (  symptoms).  You cough up yellow fluid (pus).  Your cough does not get better after 2-3 weeks, or your cough gets worse.  Medicine does not help your cough and you are not sleeping well.  You have pain that gets worse or pain that is not helped with medicine.  You have a fever.  You are losing weight and you do not know why.  You have night sweats. Get help right away if:  You cough up blood.  You have trouble breathing.  Your heartbeat is very fast. This information is not intended to replace advice given to you by your health care provider. Make sure you discuss any questions you have with your health care provider. Document Released: 05/20/2011 Document Revised: 02/12/2016 Document Reviewed: 11/13/2014 Elsevier Interactive Patient Education  2017 Elsevier Inc.      Agustina Caroli, MD [facility]

## 2016-12-29 NOTE — Patient Instructions (Signed)
Cough, Adult  A cough helps to clear your throat and lungs. A cough may last only 2?3 weeks (acute), or it may last longer than 8 weeks (chronic). Many different things can cause a cough. A cough may be a sign of an illness or another medical condition.  Follow these instructions at home:  ? Pay attention to any changes in your cough.  ? Take medicines only as told by your doctor.  ? If you were prescribed an antibiotic medicine, take it as told by your doctor. Do not stop taking it even if you start to feel better.  ? Talk with your doctor before you try using a cough medicine.  ? Drink enough fluid to keep your pee (urine) clear or pale yellow.  ? If the air is dry, use a cold steam vaporizer or humidifier in your home.  ? Stay away from things that make you cough at work or at home.  ? If your cough is worse at night, try using extra pillows to raise your head up higher while you sleep.  ? Do not smoke, and try not to be around smoke. If you need help quitting, ask your doctor.  ? Do not have caffeine.  ? Do not drink alcohol.  ? Rest as needed.  Contact a doctor if:  ? You have new problems (symptoms).  ? You cough up yellow fluid (pus).  ? Your cough does not get better after 2?3 weeks, or your cough gets worse.  ? Medicine does not help your cough and you are not sleeping well.  ? You have pain that gets worse or pain that is not helped with medicine.  ? You have a fever.  ? You are losing weight and you do not know why.  ? You have night sweats.  Get help right away if:  ? You cough up blood.  ? You have trouble breathing.  ? Your heartbeat is very fast.  This information is not intended to replace advice given to you by your health care provider. Make sure you discuss any questions you have with your health care provider.  Document Released: 05/20/2011 Document Revised: 02/12/2016 Document Reviewed: 11/13/2014  Elsevier Interactive Patient Education ? 2017 Elsevier Inc.

## 2017-01-22 ENCOUNTER — Encounter (HOSPITAL_COMMUNITY): Payer: Self-pay | Admitting: Emergency Medicine

## 2017-01-22 ENCOUNTER — Ambulatory Visit (HOSPITAL_COMMUNITY)
Admission: EM | Admit: 2017-01-22 | Discharge: 2017-01-22 | Disposition: A | Discharge status: Home / Self Care | Payer: Commercial Managed Care - HMO | Attending: Internal Medicine | Admitting: Internal Medicine

## 2017-01-22 DIAGNOSIS — S29012A Strain of muscle and tendon of back wall of thorax, initial encounter: Secondary | ICD-10-CM | POA: Diagnosis not present

## 2017-01-22 DIAGNOSIS — R053 Chronic cough: Secondary | ICD-10-CM

## 2017-01-22 DIAGNOSIS — R05 Cough: Secondary | ICD-10-CM | POA: Diagnosis not present
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
## 2017-01-22 NOTE — Discharge Instructions (Signed)
Apply cold compresses to the mid back for the first couple days for pain. After that you may use heat if she continued to have pain. Perform gradual stretches of the back by bending over slowly and then trying to touch her toes with a right and then the left hand. He did not have to bend completely forward. This will help stretch the back muscles. Ibuprofen and Tylenol for pain as needed. Continue taking the Nexium for cough. If you are not getting better you may want to see her primary care doctor for some lung tests to see if you have occult bronchospasm.

## 2017-07-12 ENCOUNTER — Ambulatory Visit (INDEPENDENT_AMBULATORY_CARE_PROVIDER_SITE_OTHER): Payer: 59 | Admitting: Physician Assistant

## 2017-07-12 ENCOUNTER — Encounter: Payer: Self-pay | Admitting: Physician Assistant

## 2017-07-12 VITALS — BP 130/90 | HR 79 | Temp 98.1°F | Resp 16 | Ht 61.0 in | Wt 140.8 lb

## 2017-07-12 DIAGNOSIS — Z130 Encounter for screening for diseases of the blood and blood-forming organs and certain disorders involving the immune mechanism: Secondary | ICD-10-CM

## 2017-07-12 DIAGNOSIS — Z0000 Encounter for general adult medical examination without abnormal findings: Secondary | ICD-10-CM | POA: Diagnosis not present

## 2017-07-12 DIAGNOSIS — Z13220 Encounter for screening for lipoid disorders: Secondary | ICD-10-CM

## 2017-07-12 DIAGNOSIS — K219 Gastro-esophageal reflux disease without esophagitis: Secondary | ICD-10-CM | POA: Diagnosis not present

## 2017-07-12 DIAGNOSIS — Z23 Encounter for immunization: Secondary | ICD-10-CM

## 2017-07-12 DIAGNOSIS — Z1329 Encounter for screening for other suspected endocrine disorder: Secondary | ICD-10-CM

## 2017-07-12 LAB — POCT URINALYSIS DIP (MANUAL ENTRY)
Bilirubin, UA: NEGATIVE
Glucose, UA: NEGATIVE mg/dL
Ketones, POC UA: NEGATIVE mg/dL
Leukocytes, UA: NEGATIVE
Nitrite, UA: NEGATIVE
Protein Ur, POC: NEGATIVE mg/dL
Spec Grav, UA: 1.01 (ref 1.010–1.025)
Urobilinogen, UA: 0.2 U/dL
pH, UA: 6 (ref 5.0–8.0)

## 2017-07-12 MED ORDER — OMEPRAZOLE 20 MG PO CPDR: 20.0000 mg | DELAYED_RELEASE_CAPSULE | Freq: Every day | ORAL | 1 refills | Status: DC

## 2017-07-12 NOTE — Progress Notes (Signed)
[facility] at [facility], [place] [address] [phone]  Date:  07/12/2017   Name:  Kathleen Conway   DOB:  1981/03/31   MRN:  [id number]  PCP:  Dorise Hiss, PA-C    Chief Complaint: Annual Exam (no pap)   History of Present Illness:  This is a 36 y.o. female with PMH migraine who is presenting for CPE. She works as a Product/process development scientist. She has 3 children.  Migraines are not as bad as they used to be - don't last as long and are not as severe.    Complaints: cough x several months. Happens mostly after she is eating. Certain foods foods make it worse, like spicy foods. Endorses a few episodes of heart burn. She has tried nexium and zantac - not helping.  LMP: last month Contraception: OCPs Last pap: 10/2016 - Dr. Pola Corn GYN Sexual history: active with one partner.  Immunizations: needs flu shot Dentist: Regularly  Eye: 20/13 b/l  Diet/Exercise: she is walking. Eating healthy diet.  Fam hx: Cancer: mother (breast), father (prostate), grandfather (colon), maternal aunt x 2 (breast and lung), mat uncle (lung).  Tobacco/alcohol/substance use: Alcohol with special events. No drug use. Never smoker.   Mammogram: 58 - through [facility]. history of breast mass. Next is scheduled next month.  Colonoscopy: never Dexa: never   Review of Systems:  Review of Systems  Constitutional: Negative for chills, diaphoresis, fatigue and fever.  HENT: Negative for congestion, postnasal drip, rhinorrhea, sinus pressure, sneezing and sore throat.   Respiratory: Positive for cough. Negative for chest tightness, shortness of breath and wheezing.   Cardiovascular: Negative for chest pain and palpitations.  Gastrointestinal: Negative for abdominal pain, diarrhea, nausea and vomiting.  Genitourinary: Negative for decreased urine volume, difficulty urinating, dysuria, enuresis, flank pain, frequency, hematuria and urgency.  Musculoskeletal: Negative for back pain.   Neurological: Positive for headaches. Negative for dizziness, weakness and light-headedness.  Psychiatric/Behavioral: The patient is nervous/anxious.     Patient Active Problem List   Diagnosis Date Noted  . Acute upper respiratory infection 12/29/2016  . Cough 12/29/2016  . Work-related stress 06/08/2014  . Rhinitis, allergic 06/08/2014  . Chronic pain of left knee 05/16/2012  . Migraine headache 12/02/2011    Prior to Admission medications   Medication Sig Start Date End Date Taking? Authorizing Provider  ibuprofen (ADVIL,MOTRIN) 600 MG tablet Take 1 tablet (600 mg total) by mouth every 6 (six) hours. 07/27/15  Yes Julianne Handler, CNM  norethindrone-ethinyl estradiol (JUNEL FE,GILDESS FE,LOESTRIN FE) 1-20 MG-MCG tablet Take 1 tablet by mouth daily.   Yes [provider]  rizatriptan (MAXALT) 10 MG tablet Take 1 tablet (10 mg total) by mouth as needed for migraine. May repeat in 2 hours if needed 07/08/16 08/27/17 Yes Eimy Plaza, Gelene Mink, PA-C  fluticasone (FLONASE) 50 MCG/ACT nasal spray Place 2 sprays into both nostrils daily. Patient not taking: Reported on 07/12/2017 12/01/16   Aayliah Rotenberry, Gelene Mink, PA-C  montelukast (SINGULAIR) 10 MG tablet Take 1 tablet (10 mg total) by mouth at bedtime. Patient not taking: Reported on 07/12/2017 12/29/16   Horald Pollen, MD    Allergies  Allergen Reactions  . Topamax Nausea And Vomiting    Past Surgical History:  Procedure Laterality Date  . DILATION AND EVACUATION N/A 08/20/2014   Procedure: DILATATION AND EVACUATION;  Surgeon: Princess Bruins, MD;  Location: [facility];  Service: Gynecology;  Laterality: N/A;  . WISDOM TOOTH EXTRACTION      Social History  Substance Use Topics  . Smoking status: Never Smoker  . Smokeless tobacco: Never Used  . Alcohol use Yes     Comment: socially-wine but none with pregnancy    Family History  Problem Relation Age of Onset  . Cancer Mother 19       breast  . Cancer  Father 81       prostate  . Stroke Maternal Grandfather   . Cancer Maternal Grandfather        colon  . Cancer Paternal Grandfather   . Heart attack Paternal Grandmother   . Cancer Maternal Aunt        Breast and lung  . Cancer Maternal Uncle        Lung  . Down syndrome Paternal Aunt     Medication list has been reviewed and updated.  Physical Examination:  Physical Exam  Constitutional: She is oriented to person, place, and time. She appears well-developed and well-nourished. No distress.  HENT:  Head: Normocephalic and atraumatic.  Right Ear: Tympanic membrane normal.  Left Ear: Tympanic membrane normal.  Mouth/Throat: Oropharynx is clear and moist and mucous membranes are normal.  Eyes: Pupils are equal, round, and reactive to light. Conjunctivae and EOM are normal.  Cardiovascular: Normal rate, regular rhythm and normal heart sounds.   No murmur heard. Pulmonary/Chest: Effort normal and breath sounds normal. She has no wheezes.  Abdominal: Soft. Normal appearance. There is no hepatosplenomegaly. There is no tenderness.  Musculoskeletal: Normal range of motion.  Neurological: She is alert and oriented to person, place, and time. She has normal reflexes.  Skin: Skin is warm and dry.  Psychiatric: She has a normal mood and affect. Her behavior is normal. Judgment and thought content normal.  Vitals reviewed.   BP 130/90   Pulse 79   Temp 98.1 F (36.7 C) (Oral)   Resp 16   Ht '5\' 1"'  (1.549 m)   Wt 140 lb 12.8 oz (63.9 kg)   LMP 07/04/2017   SpO2 100%   BMI 26.60 kg/m   Results for orders placed or performed in visit on 07/12/17  POCT urinalysis dipstick  Result Value Ref Range   Color, UA yellow yellow   Clarity, UA clear clear   Glucose, UA negative negative mg/dL   Bilirubin, UA negative negative   Ketones, POC UA negative negative mg/dL   Spec Grav, UA 1.010 1.010 - 1.025   Blood, UA trace-lysed (A) negative   pH, UA 6.0 5.0 - 8.0   Protein Ur, POC  negative negative mg/dL   Urobilinogen, UA 0.2 0.2 or 1.0 E.U./dL   Nitrite, UA Negative Negative   Leukocytes, UA Negative Negative    Assessment and Plan: 1. Annual physical exam - Pt presents for annual exam. Migraines and anxiety have improved. H/o breast mass, gets regular MM with [facility]. C/o cough after eating, plan to treat with Omprazole and Famotidine, advised diet RTC in 4 weeks for recheck. Routine labs are pending, will contact with results. Flu shot given. RTC in 1 year for fasting labs.   2. Gastroesophageal reflux disease without esophagitis - omeprazole (PRILOSEC) 20 MG capsule; Take 1 capsule (20 mg total) by mouth daily.  Dispense: 30 capsule; Refill: 1  3. Flu vaccine need - Flu Vaccine QUAD 36+ mos IM  4. Screening, lipid - Lipid panel  5. Screening for deficiency anemia - CBC with Differential/Platelet  6. Screening for endocrine disorder - CMP14+EGFR - POCT urinalysis dipstick  7. Screening for thyroid disorder -  TSH   Mercer Pod, PA-C  [facility] at [facility] 07/12/2017 8:17 AM

## 2017-07-12 NOTE — Patient Instructions (Addendum)
GERD - Start taking Omeprazole 20 mg once daily for up to 4 weeks. Do not take this while breast feeding.  Pepcid (famotidine) 20mg  one at bedtime.  Come back and see me in 4-6 weeks to follow-up on your symptoms.   Food Choices for Gastroesophageal Reflux Disease, Adult When you have gastroesophageal reflux disease (GERD), the foods you eat and your eating habits are very important. Choosing the right foods can help ease the discomfort of GERD. Consider working with a diet and nutrition specialist (dietitian) to help you make healthy food choices. What general guidelines should I follow? Eating plan  Choose healthy foods low in fat, such as fruits, vegetables, whole grains, low-fat dairy products, and lean meat, fish, and poultry.  Eat frequent, small meals instead of three large meals each day. Eat your meals slowly, in a relaxed setting. Avoid bending over or lying down until 2-3 hours after eating.  Limit high-fat foods such as fatty meats or fried foods.  Limit your intake of oils, butter, and shortening to less than 8 teaspoons each day.  Avoid the following: ? Foods that cause symptoms. These may be different for different people. Keep a food diary to keep track of foods that cause symptoms. ? Alcohol. ? Drinking large amounts of liquid with meals. ? Eating meals during the 2-3 hours before bed.  Cook foods using methods other than frying. This may include baking, grilling, or broiling. Lifestyle   Maintain a healthy weight. Ask your health care provider what weight is healthy for you. If you need to lose weight, work with your health care provider to do so safely.  Exercise for at least 30 minutes on 5 or more days each week, or as told by your health care provider.  Avoid wearing clothes that fit tightly around your waist and chest.  Do not use any products that contain nicotine or tobacco, such as cigarettes and e-cigarettes. If you need help quitting, ask your health care  provider.  Sleep with the head of your bed raised. Use a wedge under the mattress or blocks under the bed frame to raise the head of the bed. What foods are not recommended? The items listed may not be a complete list. Talk with your dietitian about what dietary choices are best for you. Grains Pastries or quick breads with added fat. [place] toast. Vegetables Deep fried vegetables. [place] fries. Any vegetables prepared with added fat. Any vegetables that cause symptoms. For some people this may include tomatoes and tomato products, chili peppers, onions and garlic, and horseradish. Fruits Any fruits prepared with added fat. Any fruits that cause symptoms. For some people this may include citrus fruits, such as oranges, grapefruit, pineapple, and lemons. Meats and other protein foods High-fat meats, such as fatty beef or pork, hot dogs, ribs, ham, sausage, salami and bacon. Fried meat or protein, including fried fish and fried chicken. Nuts and nut butters. Dairy Whole milk and chocolate milk. Sour cream. Cream. Ice cream. Cream cheese. Milk shakes. Beverages Coffee and tea, with or without caffeine. Carbonated beverages. Sodas. Energy drinks. Fruit juice made with acidic fruits (such as orange or grapefruit). Tomato juice. Alcoholic drinks. Fats and oils Butter. Margarine. Shortening. Ghee. Sweets and desserts Chocolate and cocoa. Donuts. Seasoning and other foods Pepper. Peppermint and spearmint. Any condiments, herbs, or seasonings that cause symptoms. For some people, this may include curry, hot sauce, or vinegar-based salad dressings. Summary  When you have gastroesophageal reflux disease (GERD), food and lifestyle choices  are very important to help ease the discomfort of GERD.  Eat frequent, small meals instead of three large meals each day. Eat your meals slowly, in a relaxed setting. Avoid bending over or lying down until 2-3 hours after eating.  Limit high-fat foods such as fatty  meat or fried foods. This information is not intended to replace advice given to you by your health care provider. Make sure you discuss any questions you have with your health care provider. Document Released: 09/06/2005 Document Revised: 09/07/2016 Document Reviewed: 09/07/2016 Elsevier Interactive Patient Education  2017 [employer].  IF you received an x-ray today, you will receive an invoice from [facility]. Please contact [facility] at [phone] with questions or concerns regarding your invoice.   IF you received labwork today, you will receive an invoice from [place]. Please contact [facility] at [phone] with questions or concerns regarding your invoice.   Our billing staff will not be able to assist you with questions regarding bills from these companies.  You will be contacted with the lab results as soon as they are available. The fastest way to get your results is to activate your My Chart account. Instructions are located on the last page of this paperwork. If you have not heard from [place] regarding the results in 2 weeks, please contact this office.    Influenza (Flu) Vaccine (Inactivated or Recombinant): What You Need to Know 1. Why get vaccinated? Influenza ("flu") is a contagious disease that spreads around the [place] every year, usually between October and May. Flu is caused by influenza viruses, and is spread mainly by coughing, sneezing, and close contact. Anyone can get flu. Flu strikes suddenly and can last several days. Symptoms vary by age, but can include:  fever/chills  sore throat  muscle aches  fatigue  cough  headache  runny or stuffy nose  Flu can also lead to pneumonia and blood infections, and cause diarrhea and seizures in children. If you have a medical condition, such as heart or lung disease, flu can make it worse. Flu is more dangerous for some people. Infants and young children, people 58 years of age and older,  pregnant women, and people with certain health conditions or a weakened immune system are at greatest risk. Each year thousands of people in the [place] die from flu, and many more are hospitalized. Flu vaccine can:  keep you from getting flu,  make flu less severe if you do get it, and  keep you from spreading flu to your family and other people. 2. Inactivated and recombinant flu vaccines A dose of flu vaccine is recommended every flu season. Children 6 months through 59 years of age may need two doses during the same flu season. Everyone else needs only one dose each flu season. Some inactivated flu vaccines contain a very small amount of a mercury-based preservative called thimerosal. Studies have not shown thimerosal in vaccines to be harmful, but flu vaccines that do not contain thimerosal are available. There is no live flu virus in flu shots. They cannot cause the flu. There are many flu viruses, and they are always changing. Each year a new flu vaccine is made to protect against three or four viruses that are likely to cause disease in the upcoming flu season. But even when the vaccine doesn't exactly match these viruses, it may still provide some protection. Flu vaccine cannot prevent:  flu that is caused by a virus not covered by the vaccine, or  illnesses that look like flu but are not.  It takes about 2 weeks for protection to develop after vaccination, and protection lasts through the flu season. 3. Some people should not get this vaccine Tell the person who is giving you the vaccine:  If you have any severe, life-threatening allergies. If you ever had a life-threatening allergic reaction after a dose of flu vaccine, or have a severe allergy to any part of this vaccine, you may be advised not to get vaccinated. Most, but not all, types of flu vaccine contain a small amount of egg protein.  If you ever had Guillain-Barr Syndrome (also called GBS). Some people with a  history of GBS should not get this vaccine. This should be discussed with your doctor.  If you are not feeling well. It is usually okay to get flu vaccine when you have a mild illness, but you might be asked to come back when you feel better.  4. Risks of a vaccine reaction With any medicine, including vaccines, there is a chance of reactions. These are usually mild and go away on their own, but serious reactions are also possible. Most people who get a flu shot do not have any problems with it. Minor problems following a flu shot include:  soreness, redness, or swelling where the shot was given  hoarseness  sore, red or itchy eyes  cough  fever  aches  headache  itching  fatigue  If these problems occur, they usually begin soon after the shot and last 1 or 2 days. More serious problems following a flu shot can include the following:  There may be a small increased risk of Guillain-Barre Syndrome (GBS) after inactivated flu vaccine. This risk has been estimated at 1 or 2 additional cases per million people vaccinated. This is much lower than the risk of severe complications from flu, which can be prevented by flu vaccine.  Young children who get the flu shot along with pneumococcal vaccine (PCV13) and/or DTaP vaccine at the same time might be slightly more likely to have a seizure caused by fever. Ask your doctor for more information. Tell your doctor if a child who is getting flu vaccine has ever had a seizure.  Problems that could happen after any injected vaccine:  People sometimes faint after a medical procedure, including vaccination. Sitting or lying down for about 15 minutes can help prevent fainting, and injuries caused by a fall. Tell your doctor if you feel dizzy, or have vision changes or ringing in the ears.  Some people get severe pain in the shoulder and have difficulty moving the arm where a shot was given. This happens very rarely.  Any medication can cause a  severe allergic reaction. Such reactions from a vaccine are very rare, estimated at about 1 in a million doses, and would happen within a few minutes to a few hours after the vaccination. As with any medicine, there is a very remote chance of a vaccine causing a serious injury or death. The safety of vaccines is always being monitored. For more information, visit: http://www.aguilar.org/ 5. What if there is a serious reaction? What should I look for? Look for anything that concerns you, such as signs of a severe allergic reaction, very high fever, or unusual behavior. Signs of a severe allergic reaction can include hives, swelling of the face and throat, difficulty breathing, a fast heartbeat, dizziness, and weakness. These would start a few minutes to a few hours after the vaccination. What  should I do?  If you think it is a severe allergic reaction or other emergency that can't wait, call 9-1-1 and get the person to the nearest hospital. Otherwise, call your doctor.  Reactions should be reported to the Vaccine Adverse Event Reporting System (VAERS). Your doctor should file this report, or you can do it yourself through the VAERS web site at www.vaers.SamedayNews.es, or by calling 760 055 2956. ? VAERS does not give medical advice. 6. The National Vaccine Injury Compensation Program The [employer] Vaccine Injury Compensation Program (VICP) is a federal program that was created to compensate people who may have been injured by certain vaccines. Persons who believe they may have been injured by a vaccine can learn about the program and about filing a claim by calling 430-853-8199 or visiting the [place] website at GoldCloset.com.ee. There is a time limit to file a claim for compensation. 7. How can I learn more?  Ask your healthcare provider. He or she can give you the vaccine package insert or suggest other sources of information.  Call your local or state health department.  Contact  the Centers for Disease Control and Prevention (CDC): ? Call (581)563-4073 (1-800-CDC-INFO) or ? Visit CDC's website at https://gibson.com/ Vaccine Information Statement, Inactivated Influenza Vaccine (04/26/2014) This information is not intended to replace advice given to you by your health care provider. Make sure you discuss any questions you have with your health care provider. Document Released: 07/01/2006 Document Revised: 05/27/2016 Document Reviewed: 05/27/2016 Elsevier Interactive Patient Education  2017 [employer].

## 2017-07-13 ENCOUNTER — Encounter: Payer: Commercial Managed Care - HMO | Admitting: Physician Assistant

## 2017-07-13 LAB — LIPID PANEL
Chol/HDL Ratio: 3.9 ratio (ref 0.0–4.4)
Cholesterol, Total: 195 mg/dL (ref 100–199)
HDL: 50 mg/dL (ref >39)
LDL Calculated: 129 mg/dL — ABNORMAL HIGH (ref 0–99)
Triglycerides: 82 mg/dL (ref 0–149)
VLDL Cholesterol Cal: 16 mg/dL (ref 5–40)

## 2017-07-13 LAB — CMP14+EGFR
ALT: 15 IU/L (ref 0–32)
AST: 14 IU/L (ref 0–40)
Albumin/Globulin Ratio: 1.7 (ref 1.2–2.2)
Albumin: 4.4 g/dL (ref 3.5–5.5)
Alkaline Phosphatase: 51 IU/L (ref 39–117)
BUN/Creatinine Ratio: 9 (ref 9–23)
BUN: 8 mg/dL (ref 6–20)
Bilirubin Total: 0.5 mg/dL (ref 0.0–1.2)
CO2: 23 mmol/L (ref 20–29)
Calcium: 9.2 mg/dL (ref 8.7–10.2)
Chloride: 101 mmol/L (ref 96–106)
Creatinine, Ser: 0.87 mg/dL (ref 0.57–1.00)
GFR calc Af Amer: 98 mL/min/{1.73_m2} (ref >59)
GFR calc non Af Amer: 85 mL/min/{1.73_m2} (ref >59)
Globulin, Total: 2.6 g/dL (ref 1.5–4.5)
Glucose: 94 mg/dL (ref 65–99)
Potassium: 4.1 mmol/L (ref 3.5–5.2)
Sodium: 139 mmol/L (ref 134–144)
Total Protein: 7 g/dL (ref 6.0–8.5)

## 2017-07-13 LAB — CBC WITH DIFFERENTIAL/PLATELET
Basophils Absolute: 0 10*3/uL (ref 0.0–0.2)
Basos: 1 %
EOS (ABSOLUTE): 0.1 10*3/uL (ref 0.0–0.4)
Eos: 1 %
Hematocrit: 42 % (ref 34.0–46.6)
Hemoglobin: 13.3 g/dL (ref 11.1–15.9)
Immature Grans (Abs): 0 10*3/uL (ref 0.0–0.1)
Immature Granulocytes: 0 %
Lymphocytes Absolute: 1.7 10*3/uL (ref 0.7–3.1)
Lymphs: 35 %
MCH: 29.8 pg (ref 26.6–33.0)
MCHC: 31.7 g/dL (ref 31.5–35.7)
MCV: 94 fL (ref 79–97)
Monocytes Absolute: 0.4 10*3/uL (ref 0.1–0.9)
Monocytes: 7 %
Neutrophils Absolute: 2.8 10*3/uL (ref 1.4–7.0)
Neutrophils: 56 %
Platelets: 289 10*3/uL (ref 150–379)
RBC: 4.47 x10E6/uL (ref 3.77–5.28)
RDW: 12.8 % (ref 12.3–15.4)
WBC: 4.9 10*3/uL (ref 3.4–10.8)

## 2017-07-13 LAB — TSH: TSH: 1.18 u[IU]/mL (ref 0.450–4.500)

## 2017-08-01 DIAGNOSIS — Z1231 Encounter for screening mammogram for malignant neoplasm of breast: Secondary | ICD-10-CM | POA: Diagnosis not present

## 2017-08-01 LAB — HM MAMMOGRAPHY

## 2017-11-07 ENCOUNTER — Other Ambulatory Visit: Payer: Self-pay | Admitting: Physician Assistant

## 2017-11-07 DIAGNOSIS — F419 Anxiety disorder, unspecified: Secondary | ICD-10-CM

## 2017-11-08 NOTE — Telephone Encounter (Signed)
Celexa refill request Do not see where she is on this medication now.   Last refill 08/03/16. [id number] [place], [place]  [address].

## 2017-12-08 DIAGNOSIS — N870 Mild cervical dysplasia: Secondary | ICD-10-CM | POA: Diagnosis not present

## 2017-12-08 DIAGNOSIS — Z01419 Encounter for gynecological examination (general) (routine) without abnormal findings: Secondary | ICD-10-CM | POA: Diagnosis not present

## 2017-12-08 LAB — HM PAP SMEAR: HM Pap smear: NEGATIVE

## 2018-02-27 ENCOUNTER — Encounter: Payer: Self-pay | Admitting: *Deleted

## 2018-04-24 ENCOUNTER — Encounter: Payer: Self-pay | Admitting: Physician Assistant

## 2018-04-24 ENCOUNTER — Ambulatory Visit: Payer: 59 | Admitting: Physician Assistant

## 2018-04-24 ENCOUNTER — Other Ambulatory Visit: Payer: Self-pay

## 2018-04-24 VITALS — BP 150/82 | HR 81 | Temp 98.6°F | Resp 18 | Ht 61.0 in | Wt 147.8 lb

## 2018-04-24 DIAGNOSIS — F329 Major depressive disorder, single episode, unspecified: Secondary | ICD-10-CM

## 2018-04-24 DIAGNOSIS — G43019 Migraine without aura, intractable, without status migrainosus: Secondary | ICD-10-CM

## 2018-04-24 DIAGNOSIS — K219 Gastro-esophageal reflux disease without esophagitis: Secondary | ICD-10-CM

## 2018-04-24 DIAGNOSIS — R05 Cough: Secondary | ICD-10-CM | POA: Diagnosis not present

## 2018-04-24 DIAGNOSIS — J302 Other seasonal allergic rhinitis: Secondary | ICD-10-CM | POA: Diagnosis not present

## 2018-04-24 DIAGNOSIS — R059 Cough, unspecified: Secondary | ICD-10-CM

## 2018-04-24 DIAGNOSIS — F32A Depression, unspecified: Secondary | ICD-10-CM

## 2018-04-24 MED ORDER — RIZATRIPTAN BENZOATE 10 MG PO TABS
10.0000 mg | ORAL_TABLET | ORAL | 5 refills | Status: DC | PRN
Start: 2018-04-24 — End: 2020-10-15

## 2018-04-24 MED ORDER — ESCITALOPRAM OXALATE 5 MG PO TABS: 5.0000 mg | ORAL_TABLET | Freq: Every day | ORAL | 2 refills | Status: DC

## 2018-04-24 MED ORDER — PANTOPRAZOLE SODIUM 40 MG PO TBEC: 40.0000 mg | DELAYED_RELEASE_TABLET | Freq: Every day | ORAL | 1 refills | Status: DC

## 2018-04-24 NOTE — Patient Instructions (Addendum)
Pantoprazole is for your cough. Take 23m 30 min before your first meal of the day EVERY DAY.   For your mood: Start taking Lexapro 115mdaily in the morning. After 3 weeks if you are not improving, you may increase to 2053maily.   For therapy -- [facility] ([address] [phone] [place] [phone] - [address]([phone] [phone]  - [phone] not file insurance)  Come back and see me in 4-6 weeks to recheck.    RELAXATION Consider practicing mindfulness meditation or other relaxation techniques such as deep breathing, prayer, yoga, tai chi, massage. See website mindful.org or the apps Headspace or Calm to help get started.   SLEEP Try to get at least 7-8+ hours sleep per day. Regular exercise and reduced caffeine will help you sleep better. Practice good sleep hygeine techniques. See website sleep.org for more information.   Thank you for coming in today. I hope you feel we met your needs.  Feel free to call PCP if you have any questions or further requests.  Please consider signing up for MyChart if you do not already have it, as this is a great way to communicate with me.  Best,  Whitney McVey, PA-C  IF you received an x-ray today, you will receive an [facility]. Please contact [facility] at [phone] questions or concerns regarding your invoice.   IF you received labwork today, you will receive an invoice from [facility] at [phone] questions or concerns regarding your invoice.   Our billing staff will not be able to assist you with questions regarding bills from these  companies.  You will be contacted with the lab results as soon as they are available. The fastest way to get your results is to activate your My Chart account. Instructions are located on the last page of this paperwork. If you have not heard from us [place] the results in 2 weeks, please contact this office.

## 2018-04-24 NOTE — Progress Notes (Signed)
Kathleen Conway  MRN: [id number] DOB: 05-Dec-1980  PCP: Dorise Hiss, PA-C  Subjective:  Pt is a pleasant 37 year old female who presents to clinic for cough x 1 month.  This is not a new problem for her.  She was treated for this last year with a PPI and over-the-counter antihistamines. " It took about a month but my cough went away". She was taking Zyrtec daily. Stopped this around 08/2017 "I stopped taking Zyrtec bc I felt better". Last month her symptoms started back up again. Cough is daily.  She started taking otc Prilosec, Zyrtec and flonase x 3 weeks.  Cough is still present.  Also endorses feelings of sadness.  "sometimes a cry for no reason" "I don't want to hang out with my friends anymore".  She was treated a long time ago for depression "but I don't think I took the medicine long enough".  "I think this is a problem I've had for a long time, I just never admitted it".  Denies HI or SI.   She would like refill of her migraine medication.  Review of Systems  Constitutional: Negative for chills, diaphoresis, fatigue and fever.  HENT: Negative for congestion, postnasal drip, rhinorrhea, sinus pressure, sinus pain, sneezing, sore throat and trouble swallowing.   Respiratory: Positive for cough. Negative for chest tightness, shortness of breath and wheezing.   Cardiovascular: Negative for chest pain and palpitations.  Allergic/Immunologic: Positive for environmental allergies. Negative for food allergies.  Psychiatric/Behavioral: Positive for dysphoric mood. Negative for self-injury and suicidal ideas. The patient is nervous/anxious.     Patient Active Problem List   Diagnosis Date Noted  . Gastroesophageal reflux disease without esophagitis 07/12/2017  . Acute upper respiratory infection 12/29/2016  . Cough 12/29/2016  . Work-related stress 06/08/2014  . Rhinitis, allergic 06/08/2014  . Chronic pain of left knee 05/16/2012  . Migraine headache 12/02/2011     Current Outpatient Medications on File Prior to Visit  Medication Sig Dispense Refill  . ibuprofen (ADVIL,MOTRIN) 600 MG tablet Take 1 tablet (600 mg total) by mouth every 6 (six) hours. 30 tablet 0  . omeprazole (PRILOSEC) 20 MG capsule Take 1 capsule (20 mg total) by mouth daily. 30 capsule 1  . norethindrone-ethinyl estradiol (JUNEL FE,GILDESS FE,LOESTRIN FE) 1-20 MG-MCG tablet Take 1 tablet by mouth daily.    . rizatriptan (MAXALT) 10 MG tablet Take 1 tablet (10 mg total) by mouth as needed for migraine. May repeat in 2 hours if needed 10 tablet 5   No current facility-administered medications on file prior to visit.     Allergies  Allergen Reactions  . Topamax Nausea And Vomiting     Objective:  LMP 04/22/2018  Vitals:   04/24/18 1655  BP: (!) 150/82  Pulse: 81  Resp: 18  Temp: 98.6 F (37 C)  SpO2: 98%    Physical Exam  Constitutional: She is oriented to person, place, and time. No distress.  HENT:  Right Ear: Tympanic membrane normal.  Left Ear: Tympanic membrane normal.  Nose: Mucosal edema present. No rhinorrhea. Right sinus exhibits no maxillary sinus tenderness and no frontal sinus tenderness. Left sinus exhibits no maxillary sinus tenderness and no frontal sinus tenderness.  Mouth/Throat: Oropharynx is clear and moist and mucous membranes are normal.  Cardiovascular: Normal rate, regular rhythm and normal heart sounds.  Pulmonary/Chest: Effort normal and breath sounds normal. No respiratory distress. She has no wheezes. She has no rales.  Neurological: She is alert and oriented to person,  place, and time.  Skin: Skin is warm and dry.  Psychiatric: She has a normal mood and affect. Her behavior is normal. Judgment normal.  Tearful   Vitals reviewed.   Assessment and Plan :  1. Cough - Recurring problem for this pt, historically controlled with PPI and antihistamine. Will start Protonix 40mg  q am. RTC in 4-6 weeks for recheck.   2. Gastroesophageal  reflux disease, esophagitis presence not specified - pantoprazole (PROTONIX) 40 MG tablet; Take 1 tablet (40 mg total) by mouth daily.  Dispense: 30 tablet; Refill: 1  3. Seasonal allergies - advised otc antihistamine  4. Depression, unspecified depression type - Denies HI or SI. Encouraged psychiatry. Will try Lexapro 5mg  with instruction to increase to 10mg /day after 3 weeks if needed> RTC in 4-6 weeks for f/u.  - escitalopram (LEXAPRO) 5 MG tablet; Take 1 tablet (5 mg total) by mouth daily.  Dispense: 60 tablet; Refill: 2  5. Intractable migraine without aura and without status migrainosus - rizatriptan (MAXALT) 10 MG tablet; Take 1 tablet (10 mg total) by mouth as needed for migraine. May repeat in 2 hours if needed  Dispense: 10 tablet; Refill: 5   Whitney Cyrilla Durkin, PA-C  [facility] [place] 04/24/2018 4:11 PM  Please note: Portions of this report may have been transcribed using dragon voice recognition software. Every effort was made to ensure accuracy; however, inadvertent computerized transcription errors may be present.

## 2018-05-08 ENCOUNTER — Other Ambulatory Visit: Payer: Self-pay | Admitting: Physician Assistant

## 2018-05-08 ENCOUNTER — Telehealth: Payer: Self-pay | Admitting: Physician Assistant

## 2018-05-08 DIAGNOSIS — F329 Major depressive disorder, single episode, unspecified: Secondary | ICD-10-CM

## 2018-05-08 DIAGNOSIS — F32A Depression, unspecified: Secondary | ICD-10-CM

## 2018-05-08 MED ORDER — ESCITALOPRAM OXALATE 10 MG PO TABS: 10.0000 mg | ORAL_TABLET | Freq: Every day | ORAL | 3 refills | Status: DC

## 2018-05-08 NOTE — Telephone Encounter (Signed)
Copied from [place] [phone]. Topic: Quick Communication - See Telephone Encounter >> May 08, 2018  9:31 AM Ahmed Prima L wrote: CRM for notification. See Telephone encounter for: 05/08/18.  Patient states she was in the office on 8/5 and McVey wrote her a script for escitalopram (LEXAPRO) 5 MG tablet but told her it would be for 10mg  and she has been taking (2) of the 5mg  to make it 10mg . She is requesting a new script fot the 10 mg tablets because she will be running out of the pills soon.   [place], [place] AT [facility] OF [address] [place] [phone]

## 2018-05-08 NOTE — Telephone Encounter (Signed)
Rx sent for lexapro 10mg 

## 2018-05-09 NOTE — Telephone Encounter (Signed)
Pt informed of refill sent to pharmacy.

## 2018-05-31 ENCOUNTER — Ambulatory Visit: Payer: 59 | Admitting: Physician Assistant

## 2018-05-31 ENCOUNTER — Encounter: Payer: Self-pay | Admitting: Physician Assistant

## 2018-05-31 ENCOUNTER — Other Ambulatory Visit: Payer: Self-pay

## 2018-05-31 VITALS — BP 128/78 | HR 87 | Temp 98.3°F | Resp 16 | Ht 61.0 in | Wt 141.0 lb

## 2018-05-31 DIAGNOSIS — F32A Depression, unspecified: Secondary | ICD-10-CM

## 2018-05-31 DIAGNOSIS — R059 Cough, unspecified: Secondary | ICD-10-CM

## 2018-05-31 DIAGNOSIS — R05 Cough: Secondary | ICD-10-CM

## 2018-05-31 DIAGNOSIS — F329 Major depressive disorder, single episode, unspecified: Secondary | ICD-10-CM | POA: Diagnosis not present

## 2018-05-31 DIAGNOSIS — Z23 Encounter for immunization: Secondary | ICD-10-CM

## 2018-05-31 MED ORDER — MIRTAZAPINE 15 MG PO TABS: 15.0000 mg | ORAL_TABLET | Freq: Every day | ORAL | 1 refills | Status: DC

## 2018-05-31 MED ORDER — ESCITALOPRAM OXALATE 20 MG PO TABS: 20.0000 mg | ORAL_TABLET | Freq: Every day | ORAL | 3 refills | Status: DC

## 2018-05-31 NOTE — Progress Notes (Signed)
Kathleen Conway  MRN: [id number] DOB: 23-Jun-1981  PCP: Dorise Hiss, PA-C  Subjective:  Pt is a 37 year old female who presents to clinic for f/u.   She was here 8/05 Depression - started Lexapro last month with instruction to titrate up from 5mg  to 10mg . She is now 20mg . Side effect of yawning and decreased appetite - however she believes she was eating "comfort foods, and is no longer doing this".  Feels like Lexapro is helping 30% Not crying at the drop of the hat anymore.  Anxiety is still there daily.  Still no desire to interact with people.  Wakes up several times a night. She is doing the phone apps to help her fall asleep - which is working, however she is waking up several times a night.  Denies HI or SI.   Cough -  50% better since starting pantoprazole 40mg  last month. Cough happens when she eats, but now it's not every time and cough is milder. No longer feels like food comes up with cough, although this is still happening occasionally.  She is still taking antihistamine for allergies.   Review of Systems  Respiratory: Positive for cough. Negative for chest tightness, shortness of breath and wheezing.   Gastrointestinal: Negative for abdominal pain, nausea and vomiting.  Psychiatric/Behavioral: Positive for dysphoric mood and sleep disturbance. Negative for self-injury and suicidal ideas. The patient is nervous/anxious.     Patient Active Problem List   Diagnosis Date Noted  . Seasonal allergies 04/24/2018  . Gastroesophageal reflux disease without esophagitis 07/12/2017  . Acute upper respiratory infection 12/29/2016  . Cough 12/29/2016  . Work-related stress 06/08/2014  . Rhinitis, allergic 06/08/2014  . Chronic pain of left knee 05/16/2012  . Migraine headache 12/02/2011    Current Outpatient Medications on File Prior to Visit  Medication Sig Dispense Refill  . escitalopram (LEXAPRO) 10 MG tablet Take 1 tablet (10 mg total) by mouth daily. 30  tablet 3  . ibuprofen (ADVIL,MOTRIN) 600 MG tablet Take 1 tablet (600 mg total) by mouth every 6 (six) hours. 30 tablet 0  . norethindrone-ethinyl estradiol (JUNEL FE,GILDESS FE,LOESTRIN FE) 1-20 MG-MCG tablet Take 1 tablet by mouth daily.    . pantoprazole (PROTONIX) 40 MG tablet Take 1 tablet (40 mg total) by mouth daily. 30 tablet 1  . rizatriptan (MAXALT) 10 MG tablet Take 1 tablet (10 mg total) by mouth as needed for migraine. May repeat in 2 hours if needed 10 tablet 5   No current facility-administered medications on file prior to visit.     Allergies  Allergen Reactions  . Topamax Nausea And Vomiting     Objective:  BP 128/78   Pulse 87   Temp 98.3 F (36.8 C) (Oral)   Resp 16   Ht 5\' 1"  (1.549 m)   Wt 141 lb (64 kg)   SpO2 97%   BMI 26.64 kg/m   Physical Exam  Constitutional: She is oriented to person, place, and time. No distress.  Cardiovascular: Normal rate, regular rhythm and normal heart sounds.  Neurological: She is alert and oriented to person, place, and time.  Skin: Skin is warm and dry.  Psychiatric: Judgment normal.  Vitals reviewed.   Assessment and Plan :  1. Depression, unspecified depression type - Pt presents f/u anxiety and depression. Lexapro started last month. She is taking 20mg  with an improvement of "about 30%" - she still lacks motivation to go be with friends and has anxiety daily. Denies  Si or HI. Plan to add remeron. F/u in 4-6 weeks. If no improvement, consider changing SSRI.  - mirtazapine (REMERON) 15 MG tablet; Take 1 tablet (15 mg total) by mouth at bedtime. Increase dose in 15 mg increments at intervals no less than every 2 wks based on response.  Dispense: 60 tablet; Refill: 1 - escitalopram (LEXAPRO) 20 MG tablet; Take 1 tablet (20 mg total) by mouth daily.  Dispense: 30 tablet; Refill: 3  2. Cough - Improved with Pantoprazole 40mg , however is still happening after she eats, occasional regurgitation of food. Plan to have evaluation  from GI with possible endoscopy.  - Ambulatory referral to Gastroenterology  3. Need for prophylactic vaccination and inoculation against influenza - Administered by CMA.  - Flu Vaccine QUAD 36+ mos IM   Mercer Pod, PA-C  [facility] 05/31/2018 2:12 PM  Please note: Portions of this report may have been transcribed using dragon voice recognition software. Every effort was made to ensure accuracy; however, inadvertent computerized transcription errors may be present.

## 2018-05-31 NOTE — Patient Instructions (Addendum)
You will receive a phone call to schedule an appointment with GI to evaluate the cough.   Remeron: Initial: 15 mg once daily at bedtime; You can increase the dose in 15 mg increments at intervals no less than every 2 weeks based on response and tolerability. (Maximum dose: 45 mg/day)  Continue taking Lexapro.  Do not stop taking these medications --  you must taper.  Come back and see me in 4-6 weeks to recheck.   Mirtazapine tablets What is this medicine? MIRTAZAPINE (mir TAZ a peen) is used to treat depression. This medicine may be used for other purposes; ask your health care provider or pharmacist if you have questions. COMMON BRAND NAME(S): Remeron What should I tell my health care provider before I take this medicine? They need to know if you have any of these conditions: -bipolar disorder -glaucoma -kidney disease -liver disease -suicidal thoughts -an unusual or allergic reaction to mirtazapine, other medicines, foods, dyes, or preservatives -pregnant or trying to get pregnant -breast-feeding How should I use this medicine? Take this medicine by mouth with a glass of water. Follow the directions on the prescription label. Take your medicine at regular intervals. Do not take your medicine more often than directed. Do not stop taking this medicine suddenly except upon the advice of your doctor. Stopping this medicine too quickly may cause serious side effects or your condition may worsen. A special MedGuide will be given to you by the pharmacist with each prescription and refill. Be sure to read this information carefully each time. Talk to your pediatrician regarding the use of this medicine in children. Special care may be needed. Overdosage: If you think you have taken too much of this medicine contact a poison control center or emergency room at once. NOTE: This medicine is only for you. Do not share this medicine with others. What if I miss a dose? If you miss a dose, take  it as soon as you can. If it is almost time for your next dose, take only that dose. Do not take double or extra doses. What may interact with this medicine? Do not take this medicine with any of the following medications: -linezolid -MAOIs like Carbex, Eldepryl, Marplan, Nardil, and Parnate -methylene blue (injected into a vein) This medicine may also interact with the following medications: -alcohol -antiviral medicines for HIV or AIDS -certain medicines that treat or prevent blood clots like warfarin -certain medicines for depression, anxiety, or psychotic disturbances -certain medicines for fungal infections like ketoconazole and itraconazole -certain medicines for migraine headache like almotriptan, eletriptan, frovatriptan, naratriptan, rizatriptan, sumatriptan, zolmitriptan -certain medicines for seizures like carbamazepine or phenytoin -certain medicines for sleep -cimetidine -erythromycin -fentanyl -lithium -medicines for blood pressure -nefazodone -rasagiline -rifampin -supplements like St. John's wort, kava kava, valerian -tramadol -tryptophan This list may not describe all possible interactions. Give your health care provider a list of all the medicines, herbs, non-prescription drugs, or dietary supplements you use. Also tell them if you smoke, drink alcohol, or use illegal drugs. Some items may interact with your medicine. What should I watch for while using this medicine? Tell your doctor if your symptoms do not get better or if they get worse. Visit your doctor or health care professional for regular checks on your progress. Because it may take several weeks to see the full effects of this medicine, it is important to continue your treatment as prescribed by your doctor. Patients and their families should watch out for new or worsening thoughts of  suicide or depression. Also watch out for sudden changes in feelings such as feeling anxious, agitated, panicky, irritable,  hostile, aggressive, impulsive, severely restless, overly excited and hyperactive, or not being able to sleep. If this happens, especially at the beginning of treatment or after a change in dose, call your health care professional. Kathleen Conway may get drowsy or dizzy. Do not drive, use machinery, or do anything that needs mental alertness until you know how this medicine affects you. Do not stand or sit up quickly, especially if you are an older patient. This reduces the risk of dizzy or fainting spells. Alcohol may interfere with the effect of this medicine. Avoid alcoholic drinks. This medicine may cause dry eyes and blurred vision. If you wear contact lenses you may feel some discomfort. Lubricating drops may help. See your eye doctor if the problem does not go away or is severe. Your mouth may get dry. Chewing sugarless gum or sucking hard candy, and drinking plenty of water may help. Contact your doctor if the problem does not go away or is severe. What side effects may I notice from receiving this medicine? Side effects that you should report to your doctor or health care professional as soon as possible: -allergic reactions like skin rash, itching or hives, swelling of the face, lips, or tongue -anxious -changes in vision -chest pain -confusion -elevated mood, decreased need for sleep, racing thoughts, impulsive behavior -eye pain -fast, irregular heartbeat -feeling faint or lightheaded, falls -feeling agitated, angry, or irritable -fever or chills, sore throat -hallucination, loss of contact with reality -loss of balance or coordination -mouth sores -redness, blistering, peeling or loosening of the skin, including inside the mouth -restlessness, pacing, inability to keep still -seizures -stiff muscles -suicidal thoughts or other mood changes -trouble passing urine or change in the amount of urine -trouble sleeping -unusual bleeding or bruising -unusually weak or tired -vomiting Side  effects that usually do not require medical attention (report to your doctor or health care professional if they continue or are bothersome): -change in appetite -constipation -dizziness -dry mouth -muscle aches or pains -nausea -tired -weight gain This list may not describe all possible side effects. Call your doctor for medical advice about side effects. You may report side effects to FDA at 1-800-FDA-1088. Where should I keep my medicine? Keep out of the reach of children. Store at room temperature between 15 and 30 degrees C (59 and 86 degrees F) Protect from light and moisture. Throw away any unused medicine after the expiration date. NOTE: This sheet is a summary. It may not cover all possible information. If you have questions about this medicine, talk to your doctor, pharmacist, or health care provider.  2018 Elsevier/Gold Standard (2016-02-05 17:30:45)   Thank you for coming in today. I hope you feel we met your needs.  Feel free to call PCP if you have any questions or further requests.  Please consider signing up for MyChart if you do not already have it, as this is a great way to communicate with me.  Best,  Whitney McVey, PA-C   IF you received an x-ray today, you will receive an invoice from [facility]. Please contact [facility] at [phone] with questions or concerns regarding your invoice.   IF you received labwork today, you will receive an invoice from [facility]. Please contact [facility] at [phone] with questions or concerns regarding your invoice.   Our billing staff will not be able to assist you with questions regarding bills from these  companies.  You will be contacted with the lab results as soon as they are available. The fastest way to get your results is to activate your My Chart account. Instructions are located on the last page of this paperwork. If you have not heard from [place] regarding the results in 2 weeks, please contact this  office.

## 2018-06-19 ENCOUNTER — Other Ambulatory Visit: Payer: Self-pay | Admitting: Physician Assistant

## 2018-06-19 DIAGNOSIS — K219 Gastro-esophageal reflux disease without esophagitis: Secondary | ICD-10-CM

## 2018-06-30 ENCOUNTER — Telehealth: Payer: Self-pay | Admitting: Physician Assistant

## 2018-06-30 NOTE — Telephone Encounter (Signed)
Called pt rescheduled physical 07/13/2018 made aware of time bldg location fasting  . Pt stated she has one week left on mirtazapine (REMERON) 15 MG tablet.  She is asking for a refill to get her through ,until her scheduled appt on 07/13/2018 Please advise!

## 2018-07-01 ENCOUNTER — Other Ambulatory Visit: Payer: Self-pay | Admitting: *Deleted

## 2018-07-01 DIAGNOSIS — F32A Depression, unspecified: Secondary | ICD-10-CM

## 2018-07-01 DIAGNOSIS — F329 Major depressive disorder, single episode, unspecified: Secondary | ICD-10-CM

## 2018-07-02 NOTE — Telephone Encounter (Signed)
Pt message re: refill remeron - unough until 07/13/2018 appt sent to [facility]

## 2018-07-05 ENCOUNTER — Telehealth: Payer: Self-pay | Admitting: Physician Assistant

## 2018-07-05 ENCOUNTER — Other Ambulatory Visit: Payer: Self-pay | Admitting: Physician Assistant

## 2018-07-05 DIAGNOSIS — F329 Major depressive disorder, single episode, unspecified: Secondary | ICD-10-CM

## 2018-07-05 DIAGNOSIS — F32A Depression, unspecified: Secondary | ICD-10-CM

## 2018-07-05 MED ORDER — MIRTAZAPINE 45 MG PO TABS: 45.0000 mg | ORAL_TABLET | Freq: Every day | ORAL | 0 refills | Status: DC

## 2018-07-05 NOTE — Telephone Encounter (Signed)
Copied from [facility] [id number]. Topic: General - Other >> Jul 05, 2018 11:25 AM Lennox Solders wrote: Reason for CRM:pt has increase remeron . Pt needs new rx remeron 45 mg. [facility] [address]. Pt has an physical sch for 07/13/18

## 2018-07-05 NOTE — Telephone Encounter (Signed)
Message sent to McVey re: Remeron refill.

## 2018-07-13 ENCOUNTER — Other Ambulatory Visit: Payer: Self-pay

## 2018-07-13 ENCOUNTER — Ambulatory Visit (INDEPENDENT_AMBULATORY_CARE_PROVIDER_SITE_OTHER): Payer: 59 | Admitting: Physician Assistant

## 2018-07-13 ENCOUNTER — Encounter: Payer: Self-pay | Admitting: Physician Assistant

## 2018-07-13 VITALS — BP 149/73 | HR 87 | Temp 98.5°F | Resp 16 | Ht 61.5 in | Wt 157.4 lb

## 2018-07-13 DIAGNOSIS — Z130 Encounter for screening for diseases of the blood and blood-forming organs and certain disorders involving the immune mechanism: Secondary | ICD-10-CM

## 2018-07-13 DIAGNOSIS — Z113 Encounter for screening for infections with a predominantly sexual mode of transmission: Secondary | ICD-10-CM

## 2018-07-13 DIAGNOSIS — Z1329 Encounter for screening for other suspected endocrine disorder: Secondary | ICD-10-CM | POA: Diagnosis not present

## 2018-07-13 DIAGNOSIS — E559 Vitamin D deficiency, unspecified: Secondary | ICD-10-CM

## 2018-07-13 DIAGNOSIS — G4700 Insomnia, unspecified: Secondary | ICD-10-CM

## 2018-07-13 DIAGNOSIS — Z0000 Encounter for general adult medical examination without abnormal findings: Secondary | ICD-10-CM | POA: Diagnosis not present

## 2018-07-13 DIAGNOSIS — Z13228 Encounter for screening for other metabolic disorders: Secondary | ICD-10-CM

## 2018-07-13 DIAGNOSIS — F32A Depression, unspecified: Secondary | ICD-10-CM

## 2018-07-13 DIAGNOSIS — F329 Major depressive disorder, single episode, unspecified: Secondary | ICD-10-CM

## 2018-07-13 LAB — POCT URINALYSIS DIP (MANUAL ENTRY)
Bilirubin, UA: NEGATIVE
Blood, UA: NEGATIVE
Glucose, UA: NEGATIVE mg/dL
Ketones, POC UA: NEGATIVE mg/dL
Nitrite, UA: NEGATIVE
Protein Ur, POC: NEGATIVE mg/dL
Spec Grav, UA: 1.02 (ref 1.010–1.025)
Urobilinogen, UA: 0.2 E.U./dL
pH, UA: 6 (ref 5.0–8.0)

## 2018-07-13 MED ORDER — TRAZODONE HCL 50 MG PO TABS: 25.0000 mg | ORAL_TABLET | Freq: Every evening | ORAL | 3 refills | Status: DC | PRN

## 2018-07-13 MED ORDER — SERTRALINE HCL 50 MG PO TABS: 50.0000 mg | ORAL_TABLET | Freq: Every day | ORAL | 1 refills | Status: DC

## 2018-07-13 MED ORDER — MIRTAZAPINE 15 MG PO TABS: ORAL_TABLET | ORAL | 0 refills | Status: DC

## 2018-07-13 NOTE — Progress Notes (Signed)
[facility] at [place], [place] [address] [phone]  Date:  07/13/2018   Name:  Kathleen Conway   DOB:  1980-12-19   MRN:  [id number]  PCP:  Dorise Hiss, PA-C    Chief Complaint: No chief complaint on file.   History of Present Illness:  This is a 37 y.o. female with PMH depression who is presenting for CPE.  H/o depression - lexapro 20mg  and Remeron 45mg    Complaints: depression lexapro not working. And 15lb weight gain on remeron.  Contraception:  OCPs  Cervical Cancer Screening: 10/2017 - [facility] Breast Cancer Screening: regular screenings.  history of breast mass. Next is scheduled next month.  Colorectal Cancer Screening: not yet a candidate  HIV Screening: done a few years ago STI Screening: would like this done today Seasonal Influenza Vaccination: done Td/Tdap Vaccination: utd Pneumococcal Vaccination: not yet a candidate Zoster Vaccination: not yet a candidate Frequency of Dental evaluation: regular Q6 months Frequency of Eye evaluation: never Exercise: 3x/week  Wt Readings from Last 3 Encounters:  07/13/18 157 lb 6.4 oz (71.4 kg)  05/31/18 141 lb (64 kg)  04/24/18 147 lb 12.8 oz (67 kg)    Review of Systems:  Review of Systems  Constitutional: Negative for chills, diaphoresis, fatigue and fever.  HENT: Negative for congestion, postnasal drip, rhinorrhea, sinus pressure, sneezing and sore throat.   Respiratory: Negative for cough, chest tightness, shortness of breath and wheezing.   Cardiovascular: Negative for chest pain and palpitations.  Gastrointestinal: Negative for abdominal pain, diarrhea, nausea and vomiting.  Endocrine: Negative for polydipsia, polyphagia and polyuria.  Genitourinary: Negative for menstrual problem, vaginal bleeding and vaginal discharge.  Neurological: Negative for weakness, light-headedness and headaches.  Psychiatric/Behavioral: Positive for dysphoric mood. The patient is not  nervous/anxious.     Patient Active Problem List   Diagnosis Date Noted  . Seasonal allergies 04/24/2018  . Gastroesophageal reflux disease without esophagitis 07/12/2017  . Acute upper respiratory infection 12/29/2016  . Cough 12/29/2016  . Work-related stress 06/08/2014  . Rhinitis, allergic 06/08/2014  . Chronic pain of left knee 05/16/2012  . Migraine headache 12/02/2011    Prior to Admission medications   Medication Sig Start Date End Date Taking? Authorizing Provider  escitalopram (LEXAPRO) 20 MG tablet Take 1 tablet (20 mg total) by mouth daily. 05/31/18  Yes Suvan Stcyr, Gelene Mink, PA-C  mirtazapine (REMERON) 45 MG tablet Take 1 tablet (45 mg total) by mouth at bedtime. Increase dose in 15 mg increments at intervals no less than every 2 wks based on response. 07/05/18  Yes Erie Sica, Gelene Mink, PA-C  rizatriptan (MAXALT) 10 MG tablet Take 1 tablet (10 mg total) by mouth as needed for migraine. May repeat in 2 hours if needed 04/24/18 06/13/19 Yes Serita Degroote, Gelene Mink, PA-C  ibuprofen (ADVIL,MOTRIN) 600 MG tablet Take 1 tablet (600 mg total) by mouth every 6 (six) hours. 07/27/15   Julianne Handler, CNM  norethindrone-ethinyl estradiol (JUNEL FE,GILDESS FE,LOESTRIN FE) 1-20 MG-MCG tablet Take 1 tablet by mouth daily.    [provider]  pantoprazole (PROTONIX) 40 MG tablet TAKE 1 TABLET(40 MG) BY MOUTH DAILY Patient not taking: Reported on 07/13/2018 06/19/18   Halo Shevlin, Gelene Mink, PA-C    Allergies  Allergen Reactions  . Topamax Nausea And Vomiting    Past Surgical History:  Procedure Laterality Date  . DILATION AND EVACUATION N/A 08/20/2014   Procedure: DILATATION AND EVACUATION;  Surgeon: Princess Bruins, MD;  Location: [facility];  Service: Gynecology;  Laterality: N/A;  . WISDOM TOOTH EXTRACTION      Social History   Tobacco Use  . Smoking status: Never Smoker  . Smokeless tobacco: Never Used  Substance Use Topics  . Alcohol use: Yes    Comment:  socially-wine but none with pregnancy  . Drug use: No    Family History  Problem Relation Age of Onset  . Cancer Mother 26       breast  . Cancer Father 67       prostate  . Stroke Maternal Grandfather   . Cancer Maternal Grandfather        colon  . Cancer Paternal Grandfather   . Heart attack Paternal Grandmother   . Cancer Maternal Aunt        Breast and lung  . Cancer Maternal Uncle        Lung  . Down syndrome Paternal Aunt     Medication list has been reviewed and updated.  Physical Examination:  Physical Exam  Constitutional: She is oriented to person, place, and time. She appears well-developed and well-nourished. No distress.  HENT:  Head: Normocephalic and atraumatic.  Mouth/Throat: Oropharynx is clear and moist.  Eyes: Pupils are equal, round, and reactive to light. Conjunctivae and EOM are normal.  Neck: Normal range of motion. Neck supple. No thyromegaly present.  Cardiovascular: Normal rate, regular rhythm and normal heart sounds.  No murmur heard. Pulmonary/Chest: Effort normal and breath sounds normal. She has no wheezes.  Abdominal: Soft. She exhibits no mass. There is no tenderness.  Musculoskeletal: Normal range of motion.  Neurological: She is alert and oriented to person, place, and time. She has normal reflexes.  Skin: Skin is warm and dry.  Psychiatric: She has a normal mood and affect. Her behavior is normal. Judgment and thought content normal.  Vitals reviewed.   BP (!) 149/73 (BP Location: Right Arm, Patient Position: Sitting, Cuff Size: Normal)   Pulse 87   Temp 98.5 F (36.9 C) (Oral)   Resp 16   Ht 5' 1.5" (1.562 m)   Wt 157 lb 6.4 oz (71.4 kg)   LMP 06/14/2018   SpO2 100%   BMI 29.26 kg/m   Assessment and Plan: 1. Annual physical exam - Pt presents for annual exam. Feeling well today. Lexapro not working for depression. Feels 50% improvement. Will switch to Zoloft. Plan to titrate down from Remeron due to SE of weight gain. Will  try Trazadone. Flu shot done already. Routine labs are pending, will contact with results. MM next month. PAP done with GYN.   2. Screening for endocrine, metabolic and immunity disorder - POCT urinalysis dipstick - CBC with Differential/Platelet - Comprehensive metabolic panel  3. Vitamin D deficiency - VITAMIN D 25 Hydroxy (Vit-D Deficiency, Fractures)  4. Screen for STD (sexually transmitted disease) - Chlamydia/Gonococcus/Trichomonas, NAA  5. Depression, unspecified depression type - mirtazapine (REMERON) 15 MG tablet; Decrease dose in 15 mg increments at intervals no less than every 2 wks: 30mg  x 2 weeks, then 15mg  x 2 weeks  Dispense: 60 tablet; Refill: 0 - sertraline (ZOLOFT) 50 MG tablet; Take 1 tablet (50 mg total) by mouth daily. May increase to 75mg  after 2 weeks if needed; then 100mg  after another 2 weeks if needed  Dispense: 45 tablet; Refill: 1  6. Insomnia, unspecified type - traZODone (DESYREL) 50 MG tablet; Take 0.5-1 tablets (25-50 mg total) by mouth at bedtime as needed for sleep.  Dispense: 30 tablet; Refill: 3  Mercer Pod, PA-C  [facility] 07/13/2018 8:20 AM

## 2018-07-13 NOTE — Patient Instructions (Addendum)
1) To stop Remeron gradually taper the dose (over 2 to 4 weeks) to minimize withdrawal symptoms   2) Stop taking Lexapro. The day after you stop Lexapro start taking Zoloft. For Zoloft: Start taking 50 mg once daily; may increase dose based on response and tolerability in increments of 25 mg once weekly to a maximum of 200 mg/day  3) Trazadone (for sleep):  To start the Trazodone - start with 1/2 pill at night for the 1st 4 nights - increase the dose by 25mg  (1/2 pill) every 4-5 nights until either you sleep well, have side effects or reach a nightly dose of 150mg .  Come back and see me in 4 weeks to recheck.    If you have lab work done today you will be contacted with your lab results within the next 2 weeks.  If you have not heard from [place] then please contact us. The fastest way to get your results is to register for My Chart.   Keeping You Healthy   Get These Tests  Blood Pressure- Have your blood pressure checked by your healthcare provider at least once a year.  Normal blood pressure is 120/80.  Weight- Have your body mass index (BMI) calculated to screen for obesity.  BMI is a measure of body fat based on height and weight.  You can calculate your own BMI at [employer]  Cholesterol- Have your cholesterol checked every year.  Diabetes- Have your blood sugar checked every year if you have high blood pressure, high cholesterol, a family history of diabetes or if you are overweight.  Pap Test - Have a pap test every 1 to 5 years if you have been sexually active.  If you are older than 65 and recent pap tests have been normal you may not need additional pap tests.  In addition, if you have had a hysterectomy  for benign disease additional pap tests are not necessary.  Mammogram-Yearly mammograms are essential for early detection of breast cancer  Screening for Colon Cancer- Colonoscopy starting at age 60. Screening may begin sooner depending on your family history and  other health conditions.  Follow up colonoscopy as directed by your Gastroenterologist.  Screening for Osteoporosis- Screening begins at age 11 with bone density scanning, sooner if you are at higher risk for developing Osteoporosis.   Get these medicines  Calcium with Vitamin D- Your body requires 1200-1500 mg of Calcium a day and [phone] IU of Vitamin D a day.  You can only absorb 500 mg of Calcium at a time therefore Calcium must be taken in 2 or 3 separate doses throughout the day.  Hormones- Hormone therapy has been associated with increased risk for certain cancers and heart disease.  Talk to your healthcare provider about if you need relief from menopausal symptoms.  Aspirin- Ask your healthcare provider about taking Aspirin to prevent Heart Disease and Stroke.   Get these Immuniztions  Flu shot- Every fall  Pneumonia shot- Once after the age of 65; if you are younger ask your healthcare provider if you need a pneumonia shot.  Tetanus- Every ten years.  Zostavax- Once after the age of 28 to prevent shingles.   Take these steps  Don't smoke- Your healthcare provider can help you quit. For tips on how to quit, ask your healthcare provider or go to www.smokefree.gov or call 1-800 QUIT-NOW.  Be physically active- Exercise 5 days a week for a minimum of 30 minutes.  If you are not already physically active,  start slow and gradually work up to 30 minutes of moderate physical activity.  Try walking, dancing, bike riding, swimming, etc.  Eat a healthy diet- Eat a variety of healthy foods such as fruits, vegetables, whole grains, low fat milk, low fat cheeses, yogurt, lean meats, chicken, fish, eggs, dried beans, tofu, etc.  For more information go to www.thenutritionsource.org  Dental visit- Brush and floss teeth twice daily; visit your dentist twice a year.  Eye exam- Visit your Optometrist or Ophthalmologist yearly.  Drink alcohol in moderation- Limit alcohol intake to one drink  or less a day.  Never drink and drive.  Depression- Your emotional health is as important as your physical health.  If you're feeling down or losing interest in things you normally enjoy, please talk to your healthcare provider.  Seat Belts- can save your life; always wear one  Smoke/Carbon Monoxide detectors- These detectors need to be installed on the appropriate level of your home.  Replace batteries at least once a year.  Violence- If anyone is threatening or hurting you, please tell your healthcare provider.  Living Will/ Health care power of attorney- Discuss with your healthcare provider and family.  IF you received an x-ray today, you will receive an invoice from [facility]. Please contact [facility] at [phone] with questions or concerns regarding your invoice.   IF you received labwork today, you will receive an invoice from [place]. Please contact [facility] at [phone] with questions or concerns regarding your invoice.   Our billing staff will not be able to assist you with questions regarding bills from these companies.  You will be contacted with the lab results as soon as they are available. The fastest way to get your results is to activate your My Chart account. Instructions are located on the last page of this paperwork. If you have not heard from [place] regarding the results in 2 weeks, please contact this office.

## 2018-07-14 LAB — COMPREHENSIVE METABOLIC PANEL
ALT: 19 IU/L (ref 0–32)
AST: 17 IU/L (ref 0–40)
Albumin/Globulin Ratio: 1.7 (ref 1.2–2.2)
Albumin: 4.4 g/dL (ref 3.5–5.5)
Alkaline Phosphatase: 60 IU/L (ref 39–117)
BUN/Creatinine Ratio: 15 (ref 9–23)
BUN: 13 mg/dL (ref 6–20)
Bilirubin Total: 0.3 mg/dL (ref 0.0–1.2)
CO2: 23 mmol/L (ref 20–29)
Calcium: 9.5 mg/dL (ref 8.7–10.2)
Chloride: 101 mmol/L (ref 96–106)
Creatinine, Ser: 0.85 mg/dL (ref 0.57–1.00)
GFR calc Af Amer: 100 mL/min/{1.73_m2} (ref >59)
GFR calc non Af Amer: 87 mL/min/{1.73_m2} (ref >59)
Globulin, Total: 2.6 g/dL (ref 1.5–4.5)
Glucose: 92 mg/dL (ref 65–99)
Potassium: 4 mmol/L (ref 3.5–5.2)
Sodium: 138 mmol/L (ref 134–144)
Total Protein: 7 g/dL (ref 6.0–8.5)

## 2018-07-14 LAB — CBC WITH DIFFERENTIAL/PLATELET
Basophils Absolute: 0.1 10*3/uL (ref 0.0–0.2)
Basos: 1 %
EOS (ABSOLUTE): 0.2 10*3/uL (ref 0.0–0.4)
Eos: 3 %
Hematocrit: 38.7 % (ref 34.0–46.6)
Hemoglobin: 12.8 g/dL (ref 11.1–15.9)
Immature Grans (Abs): 0 10*3/uL (ref 0.0–0.1)
Immature Granulocytes: 0 %
Lymphocytes Absolute: 1.9 10*3/uL (ref 0.7–3.1)
Lymphs: 31 %
MCH: 30.3 pg (ref 26.6–33.0)
MCHC: 33.1 g/dL (ref 31.5–35.7)
MCV: 92 fL (ref 79–97)
Monocytes Absolute: 0.6 10*3/uL (ref 0.1–0.9)
Monocytes: 9 %
Neutrophils Absolute: 3.4 10*3/uL (ref 1.4–7.0)
Neutrophils: 56 %
Platelets: 236 10*3/uL (ref 150–450)
RBC: 4.23 x10E6/uL (ref 3.77–5.28)
RDW: 13 % (ref 12.3–15.4)
WBC: 6.2 10*3/uL (ref 3.4–10.8)

## 2018-07-14 LAB — VITAMIN D 25 HYDROXY (VIT D DEFICIENCY, FRACTURES): Vit D, 25-Hydroxy: 18.8 ng/mL — ABNORMAL LOW (ref 30.0–100.0)

## 2018-07-15 LAB — CHLAMYDIA/GONOCOCCUS/TRICHOMONAS, NAA
Chlamydia by NAA: NEGATIVE
Gonococcus by NAA: NEGATIVE
Trich vag by NAA: NEGATIVE

## 2018-07-21 ENCOUNTER — Telehealth: Payer: Self-pay | Admitting: Physician Assistant

## 2018-07-21 NOTE — Telephone Encounter (Signed)
Called pt to reshcedule due to provider schedule change. Pt had canceled appt on mychart . Lost insurance coverage the patient will call to schedule at her convinience

## 2018-07-26 ENCOUNTER — Encounter: Payer: 59 | Admitting: Physician Assistant

## 2018-08-10 ENCOUNTER — Ambulatory Visit: Payer: 59 | Admitting: Physician Assistant

## 2018-11-21 ENCOUNTER — Encounter: Payer: Self-pay | Admitting: Internal Medicine

## 2018-11-28 DIAGNOSIS — Z803 Family history of malignant neoplasm of breast: Secondary | ICD-10-CM | POA: Diagnosis not present

## 2018-11-28 DIAGNOSIS — Z1231 Encounter for screening mammogram for malignant neoplasm of breast: Secondary | ICD-10-CM | POA: Diagnosis not present

## 2018-11-28 LAB — HM MAMMOGRAPHY

## 2018-12-11 DIAGNOSIS — Z6830 Body mass index (BMI) 30.0-30.9, adult: Secondary | ICD-10-CM | POA: Diagnosis not present

## 2018-12-11 DIAGNOSIS — Z01419 Encounter for gynecological examination (general) (routine) without abnormal findings: Secondary | ICD-10-CM | POA: Diagnosis not present

## 2018-12-13 ENCOUNTER — Ambulatory Visit (INDEPENDENT_AMBULATORY_CARE_PROVIDER_SITE_OTHER): Payer: 59 | Admitting: Internal Medicine

## 2018-12-13 ENCOUNTER — Encounter: Payer: Self-pay | Admitting: Internal Medicine

## 2018-12-13 ENCOUNTER — Other Ambulatory Visit: Payer: Self-pay

## 2018-12-13 VITALS — BP 124/62 | HR 83 | Ht 61.0 in | Wt 164.0 lb

## 2018-12-13 DIAGNOSIS — K219 Gastro-esophageal reflux disease without esophagitis: Secondary | ICD-10-CM | POA: Diagnosis not present

## 2018-12-13 DIAGNOSIS — R05 Cough: Secondary | ICD-10-CM | POA: Diagnosis not present

## 2018-12-13 DIAGNOSIS — R059 Cough, unspecified: Secondary | ICD-10-CM

## 2018-12-13 MED ORDER — PANTOPRAZOLE SODIUM 40 MG PO TBEC: 40.0000 mg | DELAYED_RELEASE_TABLET | Freq: Every day | ORAL | 11 refills | Status: DC

## 2018-12-13 NOTE — Progress Notes (Signed)
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 McVey regarding chronic intermittent cough query reflux disease.  Patient is new to this office and has no prior GI history.  She reports problems with intermittent chronic cough of greater than 1 years duration.  Suggested etiologies include sinus allergies and reflux.  The patient denies classic reflux symptoms.  No dysphasia.  At one juncture she was treated with over-the-counter PPI and omeprazole for several months without much improvement.  At one point was placed on pantoprazole 40 mg daily for a month and she stated that this seemed to help within 3 weeks.  She has been on no PPI since October.  She seemingly did well until January when she developed recurrent problems with cough.  More recently treated with Flonase and Zyrtec which she thinks has helped.  She describes issues with cough which seemed to occur often after meals (approximately 5 minutes).  She has gained 30 pounds over the past year.  Her GI review of systems is otherwise negative.  REVIEW OF SYSTEMS:  All non-GI ROS negative unless otherwise stated in the HPI except for allergies, anxiety, cough, depression, fatigue, headaches, sleeping problems  Past Medical History:  Diagnosis Date  . Allergy   . Anxiety   . Carpal tunnel syndrome during pregnancy   . Hx of varicella   . Migraines   . Postpartum care following vaginal delivery (11/4) 07/26/2015  . Vaginal Pap smear, abnormal     Past Surgical History:  Procedure Laterality Date  . DILATION AND EVACUATION N/A 08/20/2014   Procedure: DILATATION AND EVACUATION;  Surgeon: Princess Bruins, MD;  Location: [facility];  Service: Gynecology;  Laterality: N/A;  . WISDOM TOOTH EXTRACTION      Social History [facility]  reports that she has never smoked. She has never used smokeless tobacco. She reports current alcohol use.  She reports that she does not use drugs.  family history includes Cancer in her maternal aunt, maternal grandfather, maternal uncle, and paternal grandfather; Cancer (age of onset: 80) in her mother; Cancer (age of onset: 84) in her father; Down syndrome in her paternal aunt; Heart attack in her paternal grandmother; Stroke in her maternal grandfather.  Allergies  Allergen Reactions  . Topamax Nausea And Vomiting       PHYSICAL EXAMINATION: Vital signs: BP 124/62   Pulse 83   Ht 5\' 1"  (1.549 m)   Wt 164 lb (74.4 kg)   LMP 11/24/2018 (Exact Date)   BMI 30.99 kg/m   Constitutional: generally well-appearing, no acute distress Psychiatric: alert and oriented x3, cooperative Eyes: extraocular movements intact, anicteric, conjunctiva pink Mouth: oral pharynx moist, no lesions Neck: supple no lymphadenopathy Cardiovascular: heart regular rate and rhythm, no murmur Lungs: clear to auscultation bilaterally Abdomen: soft, nontender, nondistended, no obvious ascites, no peritoneal signs, normal bowel sounds, no organomegaly Rectal: Omitted Extremities: no clubbing, cyanosis, or lower extremity edema bilaterally Skin: no lesions on visible extremities Neuro: No focal deficits.  Cranial nerves intact  ASSESSMENT:  1.  Chronic intermittent cough of uncertain etiology.  Possibly GERD.  Possibly multiple other entities.  Variable results were being treated with PPI and/or medications for sinus allergies.  No classic GERD symptoms.   PLAN:  1.  Reflux precautions with attention to weight loss.  Reviewed.  Supplemental literature provided 2.  Resume pantoprazole 40 mg daily until follow-up.  Prescribed.  Medication  risks and side effects reviewed. 3.  Follow-up in 3 months in my office 4.  If issue regarding her chronic cough remains unresolved then the neck step would be 24-hour pH testing off PPI with symptom association diary to be kept  A copy of this consultation note has been sent to  Ms. Magda Kiel

## 2018-12-13 NOTE — Patient Instructions (Addendum)
To help prevent the possible spread of infection to our patients, communities, and staff; we will be implementing the following measures:  As of now we are not allowing any visitors/family members to accompany you to any upcoming appointments with [facility]. If you have any concerns about this please contact our office to discuss prior to the appointment.   If you are age 40 or older, your body mass index should be between 23-30. Your Body mass index is 30.99 kg/m. If this is out of the aforementioned range listed, please consider follow up with your Primary Care Provider.  If you are age 8 or younger, your body mass index should be between 19-25. Your Body mass index is 30.99 kg/m. If this is out of the aformentioned range listed, please consider follow up with your Primary Care Provider.   We have sent the following medications to your pharmacy for you to pick up at your convenience: Protonix 40mg : Take once daily  We would like to see you back in the office for a follow up in 3 months.   Thank you for entrusting me with your care and for choosing [facility], Dr. Scarlette Shorts

## 2019-01-01 ENCOUNTER — Other Ambulatory Visit: Payer: Self-pay

## 2019-01-01 MED ORDER — PANTOPRAZOLE SODIUM 40 MG PO TBEC: 40.0000 mg | DELAYED_RELEASE_TABLET | Freq: Every day | ORAL | 3 refills | Status: DC

## 2019-05-16 ENCOUNTER — Encounter: Payer: Self-pay | Admitting: Internal Medicine

## 2019-05-16 ENCOUNTER — Ambulatory Visit: Payer: 59 | Admitting: Internal Medicine

## 2019-05-16 VITALS — BP 132/80 | HR 64 | Temp 98.2°F | Ht 61.0 in | Wt 154.2 lb

## 2019-05-16 DIAGNOSIS — R05 Cough: Secondary | ICD-10-CM | POA: Diagnosis not present

## 2019-05-16 DIAGNOSIS — K219 Gastro-esophageal reflux disease without esophagitis: Secondary | ICD-10-CM | POA: Diagnosis not present

## 2019-05-16 DIAGNOSIS — R059 Cough, unspecified: Secondary | ICD-10-CM

## 2019-05-16 NOTE — Progress Notes (Signed)
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  Possibly GERD.  Multiple other etiologies discussed.  Seem to be variable results when she was treated with PPI therapy and/or medications for sinus allergies.  See that dictation.  At that time we represcribed pantoprazole 40 mg daily.  She has been compliant with therapy since.  She tells me that about 3 or 4 weeks after her visit her problems with cough completely resolved.  She did well without difficulties for an additional 2 months until the end of June when problems return.  She experiences cough daily.  Seemingly worse after meals by her report.  No other particular features.  No new medications.  No dysphasia.  She does continue on her PPI.  REVIEW OF SYSTEMS:  All non-GI ROS negative unless otherwise stated in the HPI.  Past Medical History:  Diagnosis Date  . Allergy   . Anxiety   . Carpal tunnel syndrome during pregnancy   . Hx of varicella   . Migraines   . Postpartum care following vaginal delivery (11/4) 07/26/2015  . Vaginal Pap smear, abnormal     Past Surgical History:  Procedure Laterality Date  . DILATION AND EVACUATION N/A 08/20/2014   Procedure: DILATATION AND EVACUATION;  Surgeon: Princess Bruins, MD;  Location: [facility];  Service: Gynecology;  Laterality: N/A;  . WISDOM TOOTH EXTRACTION      Social History [place]  reports that she has never smoked. She has never used smokeless tobacco. She reports current alcohol use. She reports that she does not use drugs.  family history includes Cancer in her maternal aunt, maternal grandfather, maternal uncle, and paternal grandfather; Cancer (age of onset: 66) in her mother; Cancer (age of onset: 51) in her father; Down syndrome in her paternal aunt; Heart attack in her paternal grandmother; Stroke in her maternal  grandfather.  Allergies  Allergen Reactions  . Topamax Nausea And Vomiting       PHYSICAL EXAMINATION: Vital signs: BP 132/80   Pulse 64   Temp 98.2 F (36.8 C) (Oral)   Ht 5\' 1"  (1.549 m)   Wt 154 lb 3.2 oz (69.9 kg)   LMP 05/10/2019 (Exact Date)   BMI 29.14 kg/m   Constitutional: generally well-appearing, no acute distress Psychiatric: alert and oriented x3, cooperative Abdomen: Not reexamined Neuro: Grossly intact  ASSESSMENT:  1.  Chronic recurrent cough.  Etiology unclear.  Query GERD   PLAN:  1.  Schedule diagnostic upper endoscopy to look for objective evidence of GERD.The nature of the procedure, as well as the risks, benefits, and alternatives were carefully and thoroughly reviewed with the patient. Ample time for discussion and questions allowed. The patient understood, was satisfied, and agreed to proceed. 2.  If upper endoscopy unrevealing then schedule pH study off PPI to see if there is GERD and/or to see if there is correlation between reflux episodes and cough. 3.  Entire GI work-up is negative, then she should see a pulmonologist or cough specialist

## 2019-05-16 NOTE — Patient Instructions (Signed)
You have been scheduled for an endoscopy. Please follow written instructions given to you at your visit today. If you use inhalers (even only as needed), please bring them with you on the day of your procedure.   

## 2019-05-18 ENCOUNTER — Encounter: Payer: Self-pay | Admitting: Internal Medicine

## 2019-05-21 ENCOUNTER — Telehealth: Payer: Self-pay

## 2019-05-21 NOTE — Telephone Encounter (Signed)
Covid-19 screening questions   Do you now or have you had a fever in the last 14 days? NO   Do you have any respiratory symptoms of shortness of breath or cough now or in the last 14 days? NO, just what she is being seen for.    Do you have any family members or close contacts with diagnosed or suspected Covid-19 in the past 14 days? NO   Have you been tested for Covid-19 and found to be positive? NO

## 2019-05-22 ENCOUNTER — Ambulatory Visit (AMBULATORY_SURGERY_CENTER): Payer: 59 | Admitting: Internal Medicine

## 2019-05-22 ENCOUNTER — Other Ambulatory Visit: Payer: Self-pay

## 2019-05-22 ENCOUNTER — Encounter: Payer: Self-pay | Admitting: Internal Medicine

## 2019-05-22 VITALS — BP 134/83 | HR 69 | Temp 98.5°F | Resp 14 | Ht 61.0 in | Wt 154.0 lb

## 2019-05-22 DIAGNOSIS — R059 Cough, unspecified: Secondary | ICD-10-CM

## 2019-05-22 DIAGNOSIS — K2980 Duodenitis without bleeding: Secondary | ICD-10-CM

## 2019-05-22 DIAGNOSIS — B9681 Helicobacter pylori [H. pylori] as the cause of diseases classified elsewhere: Secondary | ICD-10-CM

## 2019-05-22 DIAGNOSIS — K219 Gastro-esophageal reflux disease without esophagitis: Secondary | ICD-10-CM

## 2019-05-22 DIAGNOSIS — R05 Cough: Secondary | ICD-10-CM

## 2019-05-22 MED ORDER — SODIUM CHLORIDE 0.9 % IV SOLN: 500.0000 mL | Freq: Once | INTRAVENOUS | Status: DC

## 2019-05-22 NOTE — Patient Instructions (Addendum)
Await pathology results.     YOU HAD AN ENDOSCOPIC PROCEDURE TODAY AT THE [facility]:   Refer to the procedure report that was given to you for any specific questions about what was found during the examination.  If the procedure report does not answer your questions, please call your gastroenterologist to clarify.  If you requested that your care partner not be given the details of your procedure findings, then the procedure report has been included in a sealed envelope for you to review at your convenience later.  YOU SHOULD EXPECT: Some feelings of bloating in the abdomen. Passage of more gas than usual.  Walking can help get rid of the air that was put into your GI tract during the procedure and reduce the bloating. If you had a lower endoscopy (such as a colonoscopy or flexible sigmoidoscopy) you may notice spotting of blood in your stool or on the toilet paper. If you underwent a bowel prep for your procedure, you may not have a normal bowel movement for a few days.  Please Note:  You might notice some irritation and congestion in your nose or some drainage.  This is from the oxygen used during your procedure.  There is no need for concern and it should clear up in a day or so.  SYMPTOMS TO REPORT IMMEDIATELY:    Following upper endoscopy (EGD)  Vomiting of blood or coffee ground material  New chest pain or pain under the shoulder blades  Painful or persistently difficult swallowing  New shortness of breath  Fever of 100F or higher  Black, tarry-looking stools  For urgent or emergent issues, a gastroenterologist can be reached at any hour by calling [phone].   DIET:  We do recommend a small meal at first, but then you may proceed to your regular diet.  Drink plenty of fluids but you should avoid alcoholic beverages for 24 hours.  ACTIVITY:  You should plan to take it easy for the rest of today and you should NOT DRIVE or use heavy machinery until tomorrow  (because of the sedation medicines used during the test).    FOLLOW UP: Our staff will call the number listed on your records 48-72 hours following your procedure to check on you and address any questions or concerns that you may have regarding the information given to you following your procedure. If we do not reach you, we will leave a message.  We will attempt to reach you two times.  During this call, we will ask if you have developed any symptoms of COVID 19. If you develop any symptoms (ie: fever, flu-like symptoms, shortness of breath, cough etc.) before then, please call [phone].  If you test positive for Covid 19 in the 2 weeks post procedure, please call and report this information to us.    If any biopsies were taken you will be contacted by phone or by letter within the next 1-3 weeks.  Please call us at [phone] if you have not heard about the biopsies in 3 weeks.    SIGNATURES/CONFIDENTIALITY: You and/or your care partner have signed paperwork which will be entered into your electronic medical record.  These signatures attest to the fact that that the information above on your After Visit Summary has been reviewed and is understood.  Full responsibility of the confidentiality of this discharge information lies with you and/or your care-partner. 

## 2019-05-22 NOTE — Op Note (Signed)
[facility] Patient Name: Kathleen Conway Procedure Date: 05/22/2019 2:59 PM MRN: [id number] Endoscopist: Docia Chuck. Henrene Pastor , MD Age: 38 Referring MD:  Date of Birth: [birth year] Gender: Female Account #: [id number] Procedure:                Upper GI endoscopy with biopsies Indications:              Chronic cough?"question GERD Medicines:                Monitored Anesthesia Care Procedure:                Pre-Anesthesia Assessment:                           - Prior to the procedure, a History and Physical                            was performed, and patient medications and                            allergies were reviewed. The patient's tolerance of                            previous anesthesia was also reviewed. The risks                            and benefits of the procedure and the sedation                            options and risks were discussed with the patient.                            All questions were answered, and informed consent                            was obtained. Prior Anticoagulants: The patient has                            taken no previous anticoagulant or antiplatelet                            agents. ASA Grade Assessment: II - A patient with                            mild systemic disease. After reviewing the risks                            and benefits, the patient was deemed in                            satisfactory condition to undergo the procedure.                           After obtaining informed consent, the endoscope was  passed under direct vision. Throughout the                            procedure, the patient's blood pressure, pulse, and                            oxygen saturations were monitored continuously. The                            Endoscope was introduced through the mouth, and                            advanced to the second part of duodenum. The upper                            GI  endoscopy was accomplished without difficulty.                            The patient tolerated the procedure well. Scope In: Scope Out: Findings:                 The esophagus was normal.                           The stomach was normal.                           The examined duodenum revealed mild inflammation of                            the bulb was otherwise normal. Biopsies were taken                            with a cold forceps for Helicobacter pylori testing                            using CLOtest.                           The cardia and gastric fundus were normal on                            retroflexion. Complications:            No immediate complications. Estimated Blood Loss:     Estimated blood loss: none. Impression:               - Normal esophagus.                           - Normal stomach.                           - Mild duodenitis. Otherwise normal examined                            duodenum. Biopsied. Recommendation:  1. STOP Protonix (pantoprazole), the acid reflux                            medication                           2. Schedule esophageal manometry with 24-hour pH                            testing and impedance testing in 2 weeks. Patient                            must be off PPI therapy for greater than 1 week.                            Dr. Silverio Decamp will interpret the study. Docia Chuck. Henrene Pastor, MD 05/22/2019 3:23:59 PM This report has been signed electronically.

## 2019-05-22 NOTE — Progress Notes (Signed)
Called to room to assist during endoscopic procedure.  Patient ID and intended procedure confirmed with present staff. Received instructions for my participation in the procedure from the performing physician.  

## 2019-05-22 NOTE — Progress Notes (Signed)
Pt Drowsy. VSS. To PACU, report to RN. No anesthetic complications noted.  

## 2019-05-22 NOTE — Progress Notes (Signed)
Pt's states no medical or surgical changes since previsit or office visit.  Temp- June Vitals- Courtney 

## 2019-05-23 ENCOUNTER — Telehealth: Payer: Self-pay

## 2019-05-23 LAB — HELICOBACTER PYLORI SCREEN-BIOPSY: UREASE: POSITIVE — AB

## 2019-05-23 NOTE — Telephone Encounter (Signed)
Pt scheduled for E mano with 24 hour pH testing and impedance testing at [facility] 06/11/19@8 :30am, pt to arrive there at 8am. Pt scheduled for covid testing 06/07/19@10 :30am, order in epic and pt knows to quarantine after her test. Pt sent instructions via mychart and mailed.

## 2019-05-24 ENCOUNTER — Telehealth: Payer: Self-pay

## 2019-05-24 NOTE — Telephone Encounter (Signed)
  Follow up Call-  Call back number 05/22/2019  Post procedure Call Back phone  # [phone]  Permission to leave phone message Yes  Some recent data might be hidden     Patient questions:  Do you have a fever, pain , or abdominal swelling? No. Pain Score  0 *  Have you tolerated food without any problems? Yes.    Have you been able to return to your normal activities? yes  Do you have any questions about your discharge instructions: Diet   No. Medications  no Follow up visit  No.  Do you have questions or concerns about your Care? No.  Actions: * If pain score is 4 or above: No action needed, pain <4.  1. Have you developed a fever since your procedure? no  2.   Have you had an respiratory symptoms (SOB or cough) since your procedure? no  3.   Have you tested positive for COVID 19 since your procedure no  4.   Have you had any family members/close contacts diagnosed with the COVID 19 since your procedure?  no   If yes to any of these questions please route to Joylene John, RN and Alphonsa Gin, Therapist, sports.

## 2019-05-25 ENCOUNTER — Telehealth: Payer: Self-pay | Admitting: Internal Medicine

## 2019-05-25 NOTE — Telephone Encounter (Signed)
The pt is aware that Vaughan Basta will contact her when she returns to the office.

## 2019-05-25 NOTE — Telephone Encounter (Signed)
Pt requested to rescheduled 9/21 procedure at [facility].

## 2019-05-29 ENCOUNTER — Telehealth: Payer: Self-pay | Admitting: Internal Medicine

## 2019-05-29 NOTE — Telephone Encounter (Signed)
Pt requested to reschedule hospital procedure scheduled 06/11/19 at [facility].

## 2019-05-29 NOTE — Telephone Encounter (Signed)
Spoke to patient and told the patient she would receive a call tomorrow from [facility] concerning rescheduling hospital procedure. Patient verbalized understanding.

## 2019-05-30 NOTE — Telephone Encounter (Signed)
Left message for pt to call back  °

## 2019-05-31 NOTE — Telephone Encounter (Signed)
Pts covid test rescheduled to 06/28/19, procedure rescheduled to 07/02/19@10 :30am. Pt aware.

## 2019-06-07 ENCOUNTER — Other Ambulatory Visit (HOSPITAL_COMMUNITY): Payer: 59

## 2019-06-28 ENCOUNTER — Other Ambulatory Visit (HOSPITAL_COMMUNITY)
Admission: RE | Admit: 2019-06-28 | Discharge: 2019-06-28 | Disposition: A | Discharge status: Home / Self Care | Payer: 59 | Source: Ambulatory Visit | Attending: Internal Medicine | Admitting: Internal Medicine

## 2019-06-28 DIAGNOSIS — Z01812 Encounter for preprocedural laboratory examination: Secondary | ICD-10-CM | POA: Diagnosis present

## 2019-06-28 DIAGNOSIS — Z20828 Contact with and (suspected) exposure to other viral communicable diseases: Secondary | ICD-10-CM | POA: Diagnosis not present

## 2019-06-29 LAB — NOVEL CORONAVIRUS, NAA (HOSP ORDER, SEND-OUT TO REF LAB; TAT 18-24 HRS): SARS-CoV-2, NAA: NOT DETECTED

## 2019-07-02 ENCOUNTER — Encounter (HOSPITAL_COMMUNITY): Payer: Self-pay | Admitting: Internal Medicine

## 2019-07-02 ENCOUNTER — Encounter (HOSPITAL_COMMUNITY)
Admission: RE | Disposition: A | Discharge status: Home / Self Care | Payer: Self-pay | Source: Home / Self Care | Attending: Internal Medicine

## 2019-07-02 ENCOUNTER — Ambulatory Visit (HOSPITAL_COMMUNITY)
Admission: RE | Admit: 2019-07-02 | Discharge: 2019-07-02 | Disposition: A | Discharge status: Home / Self Care | Payer: 59 | Attending: Internal Medicine | Admitting: Internal Medicine

## 2019-07-02 DIAGNOSIS — R05 Cough: Secondary | ICD-10-CM

## 2019-07-02 DIAGNOSIS — R053 Chronic cough: Secondary | ICD-10-CM

## 2019-07-02 HISTORY — PX: PH IMPEDANCE STUDY: SHX5565

## 2019-07-02 HISTORY — PX: ESOPHAGEAL MANOMETRY: SHX5429

## 2019-07-02 HISTORY — PX: 24 HOUR PH STUDY: SHX5419

## 2019-07-02 SURGERY — MANOMETRY, ESOPHAGUS
Anesthesia: LOCAL

## 2019-07-02 MED ORDER — LIDOCAINE VISCOUS HCL 2 % MT SOLN
OROMUCOSAL | Status: AC
  Filled 2019-07-02: qty 15

## 2019-07-02 SURGICAL SUPPLY — 2 items
FACESHIELD LNG OPTICON STERILE (SAFETY) IMPLANT
GLOVE BIO SURGEON STRL SZ8 (GLOVE) ×4 IMPLANT

## 2019-07-02 NOTE — Progress Notes (Signed)
Esophageal manometry performed per protocol without complications.  Patient tolerated well.  PH probe placed per protocol without complications  Patient tolerated well.  Ph Probe placed at 33 cm.  Education given on pH probe.  Patient verbalized understanding.  Patient to return on 07/03/19 at 1245 for probe removal.

## 2019-07-05 ENCOUNTER — Telehealth: Payer: Self-pay

## 2019-07-05 MED ORDER — CLARITHROMYCIN 500 MG PO TABS: 500.0000 mg | ORAL_TABLET | Freq: Two times a day (BID) | ORAL | 0 refills | Status: DC

## 2019-07-05 MED ORDER — PANTOPRAZOLE SODIUM 40 MG PO TBEC: 40.0000 mg | DELAYED_RELEASE_TABLET | Freq: Two times a day (BID) | ORAL | 0 refills | Status: DC

## 2019-07-05 MED ORDER — METRONIDAZOLE 250 MG PO TABS: 250.0000 mg | ORAL_TABLET | Freq: Three times a day (TID) | ORAL | 0 refills | Status: DC

## 2019-07-05 NOTE — Telephone Encounter (Signed)
-----   Message from Algernon Huxley, RN sent at 05/31/2019 10:41 AM EDT ----- treat with pantoprazole 40 mg twice daily for 2 weeks in association with Helidac (or equivalent components) during that same 2-week period.  Thanks  Call in 07/02/19

## 2019-07-05 NOTE — Telephone Encounter (Signed)
Scripts sent to pharmacy and pt aware. 

## 2019-07-18 ENCOUNTER — Telehealth: Payer: Self-pay | Admitting: Internal Medicine

## 2019-07-18 MED ORDER — CLOTRIMAZOLE 10 MG MT TROC: 10.0000 mg | Freq: Every day | OROMUCOSAL | 0 refills | Status: DC

## 2019-07-18 NOTE — Telephone Encounter (Signed)
Left detailed message for pt and prescription sent to pharmacy.

## 2019-07-18 NOTE — Telephone Encounter (Signed)
Pt will finish her meds for H pylori tomorrow. States she sees some white patches in the back of her throat and her throat is sore. Pt read her handouts from the pharmacy and it mentioned calling the doctor if you had this symptom. Please advise.

## 2019-07-18 NOTE — Telephone Encounter (Signed)
Pt thinks that she is having a side effect of metronidazole, she states that she has a white patch on her throat and sore throat. Pls call her.

## 2019-07-18 NOTE — Telephone Encounter (Signed)
May be thrush.  Prescribe Mycelex troches 5 times daily for 1 week

## 2019-08-01 ENCOUNTER — Ambulatory Visit (INDEPENDENT_AMBULATORY_CARE_PROVIDER_SITE_OTHER): Payer: 59

## 2019-08-01 ENCOUNTER — Ambulatory Visit (INDEPENDENT_AMBULATORY_CARE_PROVIDER_SITE_OTHER): Payer: 59 | Admitting: Adult Health Nurse Practitioner

## 2019-08-01 ENCOUNTER — Encounter: Payer: Self-pay | Admitting: Adult Health Nurse Practitioner

## 2019-08-01 ENCOUNTER — Other Ambulatory Visit: Payer: Self-pay

## 2019-08-01 VITALS — BP 124/81 | HR 67 | Temp 99.0°F | Ht 61.5 in | Wt 147.0 lb

## 2019-08-01 DIAGNOSIS — R05 Cough: Secondary | ICD-10-CM

## 2019-08-01 DIAGNOSIS — R5383 Other fatigue: Secondary | ICD-10-CM | POA: Diagnosis not present

## 2019-08-01 DIAGNOSIS — F419 Anxiety disorder, unspecified: Secondary | ICD-10-CM

## 2019-08-01 DIAGNOSIS — R0602 Shortness of breath: Secondary | ICD-10-CM

## 2019-08-01 DIAGNOSIS — F418 Other specified anxiety disorders: Secondary | ICD-10-CM

## 2019-08-01 DIAGNOSIS — Z0000 Encounter for general adult medical examination without abnormal findings: Secondary | ICD-10-CM

## 2019-08-01 DIAGNOSIS — F329 Major depressive disorder, single episode, unspecified: Secondary | ICD-10-CM | POA: Diagnosis not present

## 2019-08-01 DIAGNOSIS — Z0001 Encounter for general adult medical examination with abnormal findings: Secondary | ICD-10-CM | POA: Diagnosis not present

## 2019-08-01 DIAGNOSIS — E559 Vitamin D deficiency, unspecified: Secondary | ICD-10-CM

## 2019-08-01 DIAGNOSIS — R053 Chronic cough: Secondary | ICD-10-CM

## 2019-08-01 DIAGNOSIS — Z23 Encounter for immunization: Secondary | ICD-10-CM | POA: Diagnosis not present

## 2019-08-01 DIAGNOSIS — G4700 Insomnia, unspecified: Secondary | ICD-10-CM

## 2019-08-01 DIAGNOSIS — F32A Depression, unspecified: Secondary | ICD-10-CM

## 2019-08-01 DIAGNOSIS — Z8619 Personal history of other infectious and parasitic diseases: Secondary | ICD-10-CM

## 2019-08-01 MED ORDER — CLONAZEPAM 0.5 MG PO TABS: 0.5000 mg | ORAL_TABLET | Freq: Two times a day (BID) | ORAL | 1 refills | Status: DC | PRN

## 2019-08-01 MED ORDER — FLUCONAZOLE 150 MG PO TABS: 150.0000 mg | ORAL_TABLET | Freq: Every day | ORAL | 3 refills | Status: AC

## 2019-08-01 MED ORDER — NYSTATIN 100000 UNIT/ML MT SUSP: 5.0000 mL | Freq: Four times a day (QID) | OROMUCOSAL | 0 refills | Status: DC

## 2019-08-01 MED ORDER — VENLAFAXINE HCL ER 75 MG PO CP24: 75.0000 mg | ORAL_CAPSULE | Freq: Every day | ORAL | 1 refills | Status: DC

## 2019-08-01 MED ORDER — MONTELUKAST SODIUM 10 MG PO TABS: 10.0000 mg | ORAL_TABLET | Freq: Every day | ORAL | 3 refills | Status: DC

## 2019-08-01 MED ORDER — TRAZODONE HCL 50 MG PO TABS: 25.0000 mg | ORAL_TABLET | Freq: Every evening | ORAL | 3 refills | Status: DC | PRN

## 2019-08-01 MED ORDER — ALBUTEROL SULFATE HFA 108 (90 BASE) MCG/ACT IN AERS: 2.0000 | INHALATION_SPRAY | Freq: Four times a day (QID) | RESPIRATORY_TRACT | 0 refills | Status: DC | PRN

## 2019-08-01 MED ORDER — FLUCONAZOLE 150 MG PO TABS: 150.0000 mg | ORAL_TABLET | Freq: Once | ORAL | 0 refills | Status: DC

## 2019-08-01 MED ORDER — VITAMIN D (ERGOCALCIFEROL) 1.25 MG (50000 UNIT) PO CAPS: 50000.0000 [IU] | ORAL_CAPSULE | ORAL | 3 refills | Status: DC

## 2019-08-01 NOTE — Patient Instructions (Signed)
Chronic cough

## 2019-08-01 NOTE — Progress Notes (Signed)
Chief Complaint  Patient presents with  . Annual Exam    pt stated had pap in March 2020 by Dr. Lisbeth Conway.    HPI   Patient is here for her annual physical.  She reports depression and sleep are still present and not helped by previous medications.  We discussed switching SSRIs and also adding an agent for sleep.  She is having panic attacks since Covid-sometimes up to 3x a week.  Worried about everything from her family to her job. She is willing to talk to a therapist.    What is also depressing her and bothering her is a chronic cough for which she has initially had worked up by GI. Cough has lasted at least 1 year.  Was tried with PPI therapy which initially helped  3-4 weeks after her visit and then cough returned in June 2020.  Describes the cough as daily, worse after meals.  Starting to develop a fear of eating given the paroxysmal coughing she experiencing always.   Work up to date: -Upper Endoscopy:  + for H. Pylori, treated.  10/21--pH study=normal, no acid reflux; instructed to stop Protonix 12m -10/21--Esophageal mannometry =esophageal motility normal.   Discussion with GI was that if all tests to date were negative, explore pulmonary or allergic etiology.  She would like to have any tests which can make her diagnosis more clear as the chronic cough is affecting her quality of life.  She was told it could psychogenic.  She does not feel that it is given the acute onset and that it is worsened specifically by eating.  She has a hx of cancer in her family members and is concerned about potential etiologies that are neoplastic in origin.   Problem List     Patient Active Problem List   Diagnosis Date Noted  . Vitamin D deficiency 08/15/2019  . Anxiety and depression 08/15/2019  . History of Helicobacter pylori infection 08/15/2019  . Seasonal allergies 04/24/2018  . Gastroesophageal reflux disease without esophagitis 07/12/2017  . Acute upper respiratory infection 12/29/2016  .  Chronic cough 12/29/2016  . Work-related stress 06/08/2014  . Rhinitis, allergic 06/08/2014  . Chronic pain of left knee 05/16/2012  . Migraine headache 12/02/2011    Family Hx   Family history includes Cancer in her maternal aunt, maternal grandfather, maternal uncle, and paternal grandfather; Cancer (age of onset: 311 in her mother; Cancer (age of onset: 453 in her father; Down syndrome in her paternal aunt; Heart attack in her paternal grandmother; Stroke in her maternal grandfather.    Social Hx:   [name]  reports that she has never smoked. She has never used smokeless tobacco. She reports current alcohol use. She reports that she does not use drugs.  Review of Systems   .Review of Systems  Constitutional: Positive for appetite change and fatigue.  HENT: Negative for postnasal drip, rhinorrhea, sore throat and trouble swallowing.   Eyes: Negative.   Respiratory: Positive for cough. Negative for chest tightness and shortness of breath.   Cardiovascular: Negative for chest pain and leg swelling.  Gastrointestinal: Negative for blood in stool, diarrhea, nausea and vomiting.  Endocrine: Negative for cold intolerance, heat intolerance, polydipsia and polyuria.  Genitourinary: Negative.   Musculoskeletal: Positive for arthralgias and myalgias.  Skin: Negative for color change, pallor and rash.  Allergic/Immunologic: Negative.   Neurological: Positive for headaches. Negative for speech difficulty, weakness and light-headedness.  Hematological: Negative.   Psychiatric/Behavioral: Positive for decreased concentration, dysphoric mood and  sleep disturbance. Negative for self-injury and suicidal ideas. The patient is nervous/anxious.        height is 5' 1.5" (1.562 m) and weight is 147 lb (66.7 kg). Her temperature is 99 F (37.2 C). Her blood pressure is 124/81 and her pulse is 67. Her oxygen saturation is 97%.   The physical exam is generally normal. Patient appears  well, alert and oriented x 3, pleasant, cooperative. Vitals are as noted. Neck supple and free of adenopathy, or masses. No thyromegaly. PERLA. Ears, throat are normal. Lungs are clear to auscultation. Heart sounds are normal, no murmurs, clicks, gallops or rubs. Abdomen is soft, no tenderness, masses or organomegaly.  Breasts: breasts appear normal, no suspicious masses, no skin or nipple changes or axillary nodes. Self exam is encouraged. Extremities are normal. Peripheral pulses are normal. Screening neurological exam is normal without focal findings. Skin is normal without suspicious lesions noted.    Depression/Anxiety Screening    GAD 7 : Generalized Anxiety Score 08/01/2019  Nervous, Anxious, on Edge 1  Control/stop worrying 3  Worry too much - different things 3  Trouble relaxing 3  Restless 1  Easily annoyed or irritable 1  Afraid - awful might happen 1  Total GAD 7 Score 13  Anxiety Difficulty Not difficult at all   PHQ9 SCORE ONLY 08/01/2019 07/13/2018 05/31/2018  Score 14 0 0      Medications    Outpatient Medications Prior to Visit  Medication Sig Dispense Refill  . cetirizine (ZYRTEC) 10 MG tablet Take 10 mg by mouth daily.    . fluticasone (FLONASE) 50 MCG/ACT nasal spray Place 2 sprays into both nostrils daily.    . metroNIDAZOLE (FLAGYL) 250 MG tablet Take 1 tablet (250 mg total) by mouth 3 (three) times daily. (Patient not taking: Reported on 08/01/2019) 42 tablet 0  . rizatriptan (MAXALT) 10 MG tablet Take 1 tablet (10 mg total) by mouth as needed for migraine. May repeat in 2 hours if needed 10 tablet 5  . sertraline (ZOLOFT) 50 MG tablet Take 1 tablet (50 mg total) by mouth daily. May increase to 72m after 2 weeks if needed; then 1043mafter another 2 weeks if needed (Patient not taking: Reported on 08/01/2019) 45 tablet 1  . clarithromycin (BIAXIN) 500 MG tablet Take 1 tablet (500 mg total) by mouth 2 (two) times daily. 28 tablet 0  . clotrimazole (MYCELEX) 10  MG troche Take 1 tablet (10 mg total) by mouth 5 (five) times daily. 35 Troche 0  . pantoprazole (PROTONIX) 40 MG tablet Take 1 tablet (40 mg total) by mouth 2 (two) times daily before a meal. 28 tablet 0  . traZODone (DESYREL) 50 MG tablet Take 0.5-1 tablets (25-50 mg total) by mouth at bedtime as needed for sleep. (Patient not taking: Reported on 12/13/2018) 30 tablet 3   No facility-administered medications prior to visit.      Allergies    is allergic to topamax.     Assessment & Plan:   1. Annual physical exam   2. Chronic cough   3. Vitamin D deficiency   4. Fatigue due to depression   5. Anxiety associated with depression   6. Insomnia, unspecified type   7. SOB (shortness of breath)   8. Need for immunization against influenza    Orders Placed This Encounter  Procedures  . DG Chest 2 View  . CT Chest Wo Contrast  . Flu Vaccine QUAD 36+ mos IM  . Lipid panel  .  CMP14+EGFR  . Thyroid Panel With TSH  . CBC with Differential/Platelet  . VITAMIN D 25 Hydroxy (Vit-D Deficiency, Fractures)  . Ambulatory referral to Allergy  . Ambulatory referral to Pulmonology   Meds ordered this encounter Medications:Start daily.  May increase after 4-6 weeks.  Marland Kitchen venlafaxine XR (EFFEXOR XR) 75 MG 24 hr capsule   Sig: Take 1 capsule (75 mg total) by mouth daily with breakfast.   Dispense:  30 capsule   Refill:  1  Will try Singulair to see if any effect on cough  . montelukast (SINGULAIR) 10 MG tablet   Sig: Take 1 tablet (10 mg total) by mouth at bedtime.   Dispense:  30 tablet   Refill:  3 . albuterol (VENTOLIN HFA) 108 (90 Base) MCG/ACT inhaler   Sig: Inhale 2 puffs into the lungs every 6 (six) hours as needed for wheezing or shortness of breath.   Dispense:  18 g   Refill:  0 . nystatin (MYCOSTATIN) 100000 UNIT/ML suspension   Sig: Take 5 mLs (500,000 Units total) by mouth 4 (four) times daily.   Dispense:  60 mL   Refill:  0 1/2 to 1 tab qhs for sleep with instructions  given  . traZODone (DESYREL) 50 MG tablet   Sig: Take 0.5-1 tablets (25-50 mg total) by mouth at bedtime as needed for sleep.   Dispense:  30 tablet   Refill:  3 . clonazePAM (KLONOPIN) 0.5 MG tablet   Sig: Take 1 tablet (0.5 mg total) by mouth 2 (two) times daily as needed for anxiety.   Dispense:  20 tablet   Refill:  1 Persistent yeast infections  . fluconazole (DIFLUCAN) 150 MG tablet   Sig: Take 1 tablet (150 mg total) by mouth daily for 3 doses.   Dispense:  3 tablet   Refill:  3 Vitamin D deficiency: . Vitamin D, Ergocalciferol, (DRISDOL) 1.25 MG (50000 UT) CAPS capsule   Sig: Take 1 capsule (50,000 Units total) by mouth every 7 (seven) days.   Dispense:  5 capsule   Refill:  3   A total of 60 minutes were spent face-to-face with the patient during this encounter and over half of that time was spent on counseling and coordination of care.  Discussions regarding chronic cough etiolgies, initial workup, and referrals as well as pertinent labs.  Depression/Anxiety meds adjusted and pt to f/u in 6 weeks.    Glyn Ade, NP

## 2019-08-02 LAB — CMP14+EGFR
ALT: 9 IU/L (ref 0–32)
AST: 15 IU/L (ref 0–40)
Albumin/Globulin Ratio: 1.8 (ref 1.2–2.2)
Albumin: 4.6 g/dL (ref 3.8–4.8)
Alkaline Phosphatase: 67 IU/L (ref 39–117)
BUN/Creatinine Ratio: 9 (ref 9–23)
BUN: 8 mg/dL (ref 6–24)
Bilirubin Total: 0.2 mg/dL (ref 0.0–1.2)
CO2: 24 mmol/L (ref 20–29)
Calcium: 9.5 mg/dL (ref 8.7–10.2)
Chloride: 101 mmol/L (ref 96–106)
Creatinine, Ser: 0.9 mg/dL (ref 0.57–1.00)
GFR calc Af Amer: 92 mL/min/{1.73_m2} (ref >59)
GFR calc non Af Amer: 80 mL/min/{1.73_m2} (ref >59)
Globulin, Total: 2.5 g/dL (ref 1.5–4.5)
Glucose: 85 mg/dL (ref 65–99)
Potassium: 4 mmol/L (ref 3.5–5.2)
Sodium: 140 mmol/L (ref 134–144)
Total Protein: 7.1 g/dL (ref 6.0–8.5)

## 2019-08-02 LAB — CBC WITH DIFFERENTIAL/PLATELET
Basophils Absolute: 0.1 10*3/uL (ref 0.0–0.2)
Basos: 1 %
EOS (ABSOLUTE): 0.1 10*3/uL (ref 0.0–0.4)
Eos: 1 %
Hematocrit: 38.8 % (ref 34.0–46.6)
Hemoglobin: 12.8 g/dL (ref 11.1–15.9)
Immature Grans (Abs): 0 10*3/uL (ref 0.0–0.1)
Immature Granulocytes: 0 %
Lymphocytes Absolute: 2.4 10*3/uL (ref 0.7–3.1)
Lymphs: 38 %
MCH: 30.3 pg (ref 26.6–33.0)
MCHC: 33 g/dL (ref 31.5–35.7)
MCV: 92 fL (ref 79–97)
Monocytes Absolute: 0.4 10*3/uL (ref 0.1–0.9)
Monocytes: 6 %
Neutrophils Absolute: 3.5 10*3/uL (ref 1.4–7.0)
Neutrophils: 54 %
Platelets: 320 10*3/uL (ref 150–450)
RBC: 4.22 x10E6/uL (ref 3.77–5.28)
RDW: 12.9 % (ref 11.7–15.4)
WBC: 6.5 10*3/uL (ref 3.4–10.8)

## 2019-08-02 LAB — THYROID PANEL WITH TSH
Free Thyroxine Index: 1.6 (ref 1.2–4.9)
T3 Uptake Ratio: 26 % (ref 24–39)
T4, Total: 6.1 ug/dL (ref 4.5–12.0)
TSH: 1.08 u[IU]/mL (ref 0.450–4.500)

## 2019-08-02 LAB — LIPID PANEL
Chol/HDL Ratio: 3.8 ratio (ref 0.0–4.4)
Cholesterol, Total: 237 mg/dL — ABNORMAL HIGH (ref 100–199)
HDL: 62 mg/dL (ref >39)
LDL Chol Calc (NIH): 162 mg/dL — ABNORMAL HIGH (ref 0–99)
Triglycerides: 74 mg/dL (ref 0–149)
VLDL Cholesterol Cal: 13 mg/dL (ref 5–40)

## 2019-08-02 LAB — VITAMIN D 25 HYDROXY (VIT D DEFICIENCY, FRACTURES): Vit D, 25-Hydroxy: 18.7 ng/mL — ABNORMAL LOW (ref 30.0–100.0)

## 2019-08-13 ENCOUNTER — Telehealth: Payer: Self-pay | Admitting: Adult Health Nurse Practitioner

## 2019-08-13 NOTE — Telephone Encounter (Signed)
Pt Is wondering if she can take this script venlafaxine XR (EFFEXOR XR) 75 MG 24 hr capsule [id number] at night. This script makes her drowse. She would also like to be prescribed  prednisone. She has completed her other script. Please advise at [phone]

## 2019-08-14 ENCOUNTER — Encounter: Payer: Self-pay | Admitting: *Deleted

## 2019-08-15 ENCOUNTER — Encounter: Payer: Self-pay | Admitting: Adult Health Nurse Practitioner

## 2019-08-15 DIAGNOSIS — F419 Anxiety disorder, unspecified: Secondary | ICD-10-CM

## 2019-08-15 DIAGNOSIS — Z8619 Personal history of other infectious and parasitic diseases: Secondary | ICD-10-CM

## 2019-08-15 DIAGNOSIS — F329 Major depressive disorder, single episode, unspecified: Secondary | ICD-10-CM | POA: Insufficient documentation

## 2019-08-15 DIAGNOSIS — E559 Vitamin D deficiency, unspecified: Secondary | ICD-10-CM

## 2019-08-15 DIAGNOSIS — F32A Depression, unspecified: Secondary | ICD-10-CM

## 2019-08-15 HISTORY — DX: Vitamin D deficiency, unspecified: E55.9

## 2019-08-15 HISTORY — DX: Personal history of other infectious and parasitic diseases: Z86.19

## 2019-08-15 HISTORY — DX: Depression, unspecified: F32.A

## 2019-08-15 HISTORY — DX: Anxiety disorder, unspecified: F41.9

## 2019-08-20 ENCOUNTER — Other Ambulatory Visit: Payer: Self-pay

## 2019-08-20 ENCOUNTER — Ambulatory Visit
Admission: RE | Admit: 2019-08-20 | Discharge: 2019-08-20 | Disposition: A | Discharge status: Home / Self Care | Payer: 59 | Source: Ambulatory Visit | Attending: Adult Health Nurse Practitioner | Admitting: Adult Health Nurse Practitioner

## 2019-08-20 DIAGNOSIS — Z0000 Encounter for general adult medical examination without abnormal findings: Secondary | ICD-10-CM

## 2019-08-20 DIAGNOSIS — E559 Vitamin D deficiency, unspecified: Secondary | ICD-10-CM

## 2019-08-20 DIAGNOSIS — G4700 Insomnia, unspecified: Secondary | ICD-10-CM

## 2019-08-20 DIAGNOSIS — R0602 Shortness of breath: Secondary | ICD-10-CM

## 2019-08-20 DIAGNOSIS — F329 Major depressive disorder, single episode, unspecified: Secondary | ICD-10-CM

## 2019-08-20 DIAGNOSIS — R053 Chronic cough: Secondary | ICD-10-CM

## 2019-08-20 DIAGNOSIS — R05 Cough: Secondary | ICD-10-CM

## 2019-08-20 DIAGNOSIS — F418 Other specified anxiety disorders: Secondary | ICD-10-CM

## 2019-08-20 DIAGNOSIS — F32A Depression, unspecified: Secondary | ICD-10-CM

## 2019-08-23 ENCOUNTER — Other Ambulatory Visit: Payer: Self-pay

## 2019-08-23 ENCOUNTER — Ambulatory Visit: Payer: 59 | Admitting: Pulmonary Disease

## 2019-08-23 ENCOUNTER — Encounter: Payer: Self-pay | Admitting: Pulmonary Disease

## 2019-08-23 VITALS — BP 120/78 | HR 111 | Ht 61.0 in | Wt 143.0 lb

## 2019-08-23 DIAGNOSIS — R05 Cough: Secondary | ICD-10-CM

## 2019-08-23 DIAGNOSIS — R059 Cough, unspecified: Secondary | ICD-10-CM

## 2019-08-23 DIAGNOSIS — G4701 Insomnia due to medical condition: Secondary | ICD-10-CM

## 2019-08-23 NOTE — Patient Instructions (Signed)
Chronic cough -Improvement in symptoms with use of Singulair -Continue Singulair -Pay attention to exposures that worsens your cough--this will be things to avoid -It is reassuring that your CT scan is negative for any bothersome findings  Insomnia -May be related to your levels of stress/anxiety -Continue current medication -You may try clonazepam  If symptoms do not continue to be controlled -We will consider a breathing study -Allergy testing  I will see you in 3 months  Call with significant concerns

## 2019-08-23 NOTE — Progress Notes (Signed)
Subjective:    Patient ID: Kathleen Conway, female    DOB: 01/26/1981, 38 y.o.   MRN: [id number]  Patient is seen for chronic cough Insomnia  Cough has been for about 2 years Waxes and wanes  Some improvement recently noted with initiation of Singulair She also noticed that she was not coughing as much when she traveled-change in environment, cough began soon after she returned to her home environment  Work-up for reflux was negative  Has no underlying lung disease known to her  Insomnia appears to be related to stress/anxiety Medications being optimized for depression and anxiety Some of her medications liver failure and fatigue during the day  She have prescriptions for clonazepam and trazodone which she has not tried yet  Managing her stress I believe will help with insomnia   Past Medical History:  Diagnosis Date  . Allergy   . Anxiety   . Anxiety and depression 08/15/2019  . Carpal tunnel syndrome during pregnancy   . History of Helicobacter pylori infection 08/15/2019  . Hx of varicella   . Migraines   . Postpartum care following vaginal delivery (11/4) 07/26/2015  . Vaginal Pap smear, abnormal   . Vitamin D deficiency 08/15/2019   Social History   Socioeconomic History  . Marital status: Married    Spouse name: Not on file  . Number of children: Not on file  . Years of education: college  . Highest education level: Not on file  Occupational History  . Occupation: Radiation protection practitioner  Social Needs  . Financial resource strain: Not on file  . Food insecurity    Worry: Not on file    Inability: Not on file  . Transportation needs    Medical: Not on file    Non-medical: Not on file  Tobacco Use  . Smoking status: Never Smoker  . Smokeless tobacco: Never Used  Substance and Sexual Activity  . Alcohol use: Yes    Comment: socially-wine but none with pregnancy  . Drug use: No  . Sexual activity: Not on file    Comment: approx [redacted] wks  gestation  Lifestyle  . Physical activity    Days per week: Not on file    Minutes per session: Not on file  . Stress: Not on file  Relationships  . Social Herbalist on phone: Not on file    Gets together: Not on file    Attends religious service: Not on file    Active member of club or organization: Not on file    Attends meetings of clubs or organizations: Not on file    Relationship status: Not on file  . Intimate partner violence    Fear of current or ex partner: Not on file    Emotionally abused: Not on file    Physically abused: Not on file    Forced sexual activity: Not on file  Other Topics Concern  . Not on file  Social History Narrative  . Not on file   Family History  Problem Relation Age of Onset  . Cancer Mother 61       breast  . Cancer Father 54       prostate  . Stroke Maternal Grandfather   . Cancer Maternal Grandfather        colon- 37s  . Cancer Paternal Grandfather   . Heart attack Paternal Grandmother   . Cancer Maternal Aunt        Breast and lung  .  Cancer Maternal Uncle        Lung  . Down syndrome Paternal Aunt    Review of Systems  Constitutional: Positive for unexpected weight change. Negative for fever.  HENT: Negative for congestion, dental problem, ear pain, nosebleeds, postnasal drip, rhinorrhea, sinus pressure, sneezing, sore throat and trouble swallowing.   Eyes: Negative for redness and itching.  Respiratory: Positive for cough and shortness of breath. Negative for chest tightness and wheezing.   Cardiovascular: Negative for palpitations and leg swelling.  Gastrointestinal: Negative for nausea and vomiting.  Genitourinary: Negative for dysuria.  Musculoskeletal: Negative for joint swelling.  Skin: Negative for rash.  Allergic/Immunologic: Negative.  Negative for environmental allergies, food allergies and immunocompromised state.  Neurological: Positive for headaches.  Hematological: Does not bruise/bleed easily.   Psychiatric/Behavioral: Negative for dysphoric mood. The patient is nervous/anxious.        Objective:   Physical Exam Constitutional:      Appearance: Normal appearance.  HENT:     Head: Normocephalic and atraumatic.     Mouth/Throat:     Mouth: Mucous membranes are moist.  Eyes:     Pupils: Pupils are equal, round, and reactive to light.  Cardiovascular:     Rate and Rhythm: Normal rate and regular rhythm.     Heart sounds: No murmur.  Pulmonary:     Effort: Pulmonary effort is normal. No respiratory distress.     Breath sounds: Normal breath sounds. No stridor. No wheezing or rhonchi.  Skin:    General: Skin is warm.  Neurological:     General: No focal deficit present.     Mental Status: She is alert.  Psychiatric:        Mood and Affect: Mood normal.   CT scan of the chest reviewed showing no infiltrative process    Assessment & Plan:  .  Insomnia -Related to anxiety/depression -This has been treated at present -Talked about measures to help sleep quality -She does have medications that she may try to help with insomnia  .  Chronic cough -This is likely allergy related -Singulair seems to be helping -Continue lines of care -CT discussed with the patient  .  No other intervention is needed at present .. . I will see her back in the office in about 79-month .  We will consider pulmonary function testing .  Consider allergy testing if symptoms do not continue to improve .  Encourage regular 20 to 30 minutes of exercise on a regular basis-this will help sleep quality as well  Encouraged to call with any significant concerns

## 2019-08-31 ENCOUNTER — Ambulatory Visit: Payer: 59 | Admitting: Adult Health Nurse Practitioner

## 2019-08-31 ENCOUNTER — Other Ambulatory Visit: Payer: Self-pay

## 2019-08-31 ENCOUNTER — Encounter: Payer: Self-pay | Admitting: Adult Health Nurse Practitioner

## 2019-08-31 VITALS — BP 110/76 | HR 86 | Temp 98.1°F | Resp 16 | Ht 61.0 in | Wt 140.6 lb

## 2019-08-31 DIAGNOSIS — G43009 Migraine without aura, not intractable, without status migrainosus: Secondary | ICD-10-CM

## 2019-08-31 DIAGNOSIS — F329 Major depressive disorder, single episode, unspecified: Secondary | ICD-10-CM

## 2019-08-31 DIAGNOSIS — R05 Cough: Secondary | ICD-10-CM | POA: Diagnosis not present

## 2019-08-31 DIAGNOSIS — F419 Anxiety disorder, unspecified: Secondary | ICD-10-CM

## 2019-08-31 DIAGNOSIS — R053 Chronic cough: Secondary | ICD-10-CM

## 2019-08-31 DIAGNOSIS — E559 Vitamin D deficiency, unspecified: Secondary | ICD-10-CM | POA: Diagnosis not present

## 2019-08-31 DIAGNOSIS — I10 Essential (primary) hypertension: Secondary | ICD-10-CM | POA: Diagnosis not present

## 2019-08-31 MED ORDER — VITAMIN D (ERGOCALCIFEROL) 1.25 MG (50000 UNIT) PO CAPS: 50000.0000 [IU] | ORAL_CAPSULE | ORAL | 3 refills | Status: DC

## 2019-08-31 MED ORDER — CLONAZEPAM 0.5 MG PO TABS: 0.5000 mg | ORAL_TABLET | Freq: Two times a day (BID) | ORAL | 1 refills | Status: DC | PRN

## 2019-08-31 MED ORDER — PAROXETINE HCL 30 MG PO TABS: 30.0000 mg | ORAL_TABLET | Freq: Every day | ORAL | 3 refills | Status: DC

## 2019-08-31 NOTE — Progress Notes (Signed)
Subjective:    Patient ID: Kathleen Conway, female    DOB: Oct 05, 1980, 38 y.o.   MRN: [id number]  HPI   Patient is following up today on anxiety/depression/nsomnia.  Reports a lot of fatigue with Effexor.  Feels like she is at the point where it does not make her as fatigued.  However, anxiety and insomnia have not improved significantly.  Trazadone not helping her sleep.  Has tried Klonopin at night which seems to help.  Appetite has not really increased and today, she has only eaten 1 thing all day.    She reports her father-in-law recently passed away from Covid-19 so it is a hard time for her family.  Trying to consider family for the holidays but feels it is too risky.   She did go see one of the pulmonary specialists who recommended she stay on Singulair as it was improving symptoms.  Should that plateau or symptoms of chronic cough persist or worsen, would f/u for PFTs, etc. She feels comfortable with that plan of late.    GAD 7 : Generalized Anxiety Score 08/01/2019  Nervous, Anxious, on Edge 1  Control/stop worrying 3  Worry too much - different things 3  Trouble relaxing 3  Restless 1  Easily annoyed or irritable 1  Afraid - awful might happen 1  Total GAD 7 Score 13  Anxiety Difficulty Not difficult at all   PHQ9 SCORE ONLY 08/31/2019 08/01/2019 07/13/2018  Score 16 14 0      Review of Systems  General ROS: positive for  - fatigue and malaise negative for - night sweats or weight gain Respiratory ROS: positive for - cough negative for - orthopnea or shortness of breath Psychological ROS: positive for - sleep disturbances negative for - obsessive thoughts or suicidal ideation      Objective:   Physical Exam . General appearance: alert, well appearing, and in no distress, oriented to person, place, and time and anxious. Chest: clear to auscultation, no wheezes, rales or rhonchi, symmetric air entry. CVS exam: normal rate, regular rhythm, normal S1, S2, no  murmurs, rubs, clicks or gallops. Skin exam - normal coloration and turgor, no rashes, no suspicious skin lesions noted. Mental Status: normal mood, behavior, speech, dress, motor activity, and thought processes, anxious.       Assessment & Plan:   1. Essential hypertension   2. Anxiety and depression   3. Vitamin D deficiency   4. Chronic cough        Meds ordered this encounter  Medications  . clonazePAM (KLONOPIN) 0.5 MG tablet    Sig: Take 1 tablet (0.5 mg total) by mouth 2 (two) times daily as needed for anxiety.    Dispense:  45 tablet    Refill:  1  . PARoxetine (PAXIL) 30 MG tablet    Sig: Take 1 tablet (30 mg total) by mouth daily.    Dispense:  30 tablet    Refill:  3  . Vitamin D, Ergocalciferol, (DRISDOL) 1.25 MG (50000 UT) CAPS capsule    Sig: Take 1 capsule (50,000 Units total) by mouth every 7 (seven) days.    Dispense:  5 capsule    Refill:  3   Change to Paxil 30mg .  Stop Effexor 75mg .  Seems anxiety is more persistent currently.  Will use Clonozepam sparingly for sleep.   Patient is satisfied with plan.  Will f/u in 4 weeks.  Anxiety Plan Medications: Paxil. Recommended counseling. Follow up: 4 weeks. Spent 40  minutes (>50% of visit) discussing the risks of anxiety disorder and sleep disturbance, the  pathophysiology, etiology, risks, and principles of treatment.   Glyn Ade, NP

## 2019-08-31 NOTE — Patient Instructions (Signed)
° ° ° °  If you have lab work done today you will be contacted with your lab results within the next 2 weeks.  If you have not heard from us then please contact us. The fastest way to get your results is to register for My Chart. ° ° °IF you received an x-ray today, you will receive an invoice from [facility]. Please contact  Radiology at [phone] with questions or concerns regarding your invoice.  ° °IF you received labwork today, you will receive an invoice from [facility]. Please contact [facility] at [phone] with questions or concerns regarding your invoice.  ° °Our billing staff will not be able to assist you with questions regarding bills from these companies. ° °You will be contacted with the lab results as soon as they are available. The fastest way to get your results is to activate your My Chart account. Instructions are located on the last page of this paperwork. If you have not heard from us regarding the results in 2 weeks, please contact this office. °  ° ° ° °

## 2019-12-10 ENCOUNTER — Other Ambulatory Visit: Payer: Self-pay

## 2019-12-10 ENCOUNTER — Ambulatory Visit (INDEPENDENT_AMBULATORY_CARE_PROVIDER_SITE_OTHER): Payer: 59 | Admitting: Allergy

## 2019-12-10 ENCOUNTER — Encounter: Payer: Self-pay | Admitting: Allergy

## 2019-12-10 VITALS — BP 124/90 | HR 82 | Temp 98.2°F | Ht 62.0 in | Wt 142.0 lb

## 2019-12-10 DIAGNOSIS — J3089 Other allergic rhinitis: Secondary | ICD-10-CM | POA: Diagnosis not present

## 2019-12-10 DIAGNOSIS — R059 Cough, unspecified: Secondary | ICD-10-CM

## 2019-12-10 DIAGNOSIS — R05 Cough: Secondary | ICD-10-CM

## 2019-12-10 MED ORDER — MONTELUKAST SODIUM 10 MG PO TABS: 10.0000 mg | ORAL_TABLET | Freq: Every day | ORAL | 5 refills | Status: DC

## 2019-12-10 NOTE — Progress Notes (Signed)
New Patient Note  RE: Kathleen Conway MRN: [id number] DOB: 11/16/1980 Date of Office Visit: 12/10/2019  Referring provider: Wendall Mola, NP Primary care provider: Wendall Mola, NP  Chief Complaint: Asthma and Cough  History of Present Illness: I had the pleasure of seeing [name] for initial evaluation at the [facility] on 12/11/2019. She is a 39 y.o. female, who is referred here by Wendall Mola, NP for the evaluation of asthma and cough.  She reports symptoms of dry coughing with post tussive emesis for the past 2 years which has improved with starting Singulair. Slight shortness of breath with wearing mask.   Current medications include albuterol prn and Singulair with some benefit. She reports not using aerochamber with inhalers. She tried the following inhalers: none. Main triggers are after eating and exertion. In the last month, frequency of symptoms: 1-2 times a day. Frequency of nocturnal symptoms: 0x/month. Frequency of SABA use: 1x/week. Interference with physical activity: sometimes. Sleep is undisturbed. In the last 12 months, emergency room visits/urgent care visits/doctor office visits or hospitalizations due to respiratory issues: not recently. In the last 12 months, oral steroids courses: 0. Lifetime history of hospitalization for respiratory issues: no. Prior intubations: no. History of pneumonia: over 10 years ago. She was evaluated by pulmonologist in the past. Smoking exposure: no. Up to date with flu vaccine: yes.  History of reflux: unsure.  Patient was seen by pulmonology and GI.  Saw GI as they thought it was due to her acid reflux. She had EGD done which showed no reflux but treated for H pylori. CXR was normal.   She reports symptoms of nasal congestion, sneezing, rhinorrhea, itchy/watery eyes. Symptoms have been going on for 10+ years. The symptoms are present during the weather changes. Other triggers  include exposure to unsure. Anosmia: no. Headache: yes but has migraines. She has used zyrtec, Singulair with fair improvement in symptoms. Sinus infections: no. Previous work up includes: none. Previous ENT evaluation: no. Previous sinus imaging: no. History of nasal polyps: no. Last eye exam: 3 years ago  Assessment and Plan: [name] is a 39 y.o. female with: Coughing Dry coughing with posttussive emesis for the past 2 years.  Patient was seen by pulmonology and gastroenterology.  Work-up for reflux was negative and treated for H. pylori.  Chest x-ray was normal.  Started on Singulair with good benefit.  Using albuterol once a week with some benefit.  Main triggers are exertion and after eating.  Today' skin testing showed: Positive to dust mites, cockroaches.   Today's breathing test showed normal with no improvement after breathing treatment.   Even though spirometry did not show any abnormalities there is some concern for possible reactive airway disease contributed by her environmental allergens as symptoms improved after Singulair.  Start environmental control measures.   Restart Singulair (montelukast) 10mg  daily at night.   May use albuterol rescue inhaler 2 puffs or nebulizer every 4 to 6 hours as needed for shortness of breath, chest tightness, coughing, and wheezing. May use albuterol rescue inhaler 2 puffs 5 to 15 minutes prior to strenuous physical activities. Monitor frequency of use.   If above regimen does not control symptoms then will try ICS inhaler next.  Other allergic rhinitis Rhinoconjunctivitis symptoms for the past 10 years with weather changes.  Tried Zyrtec and Singulair with benefit.  No previous ENT or allergy evaluation.  Today's skin testing was positive to dust mites and cockroaches.  Start environmental control measures.   Restart Singulair (montelukast) 10mg  daily at night.  May use over the counter antihistamines such as Zyrtec (cetirizine),  Claritin (loratadine), Allegra (fexofenadine), or Xyzal (levocetirizine) daily as needed.  May use Flonase 1-2 sprays per nostril as needed.   Return in about 3 months (around 03/11/2020).  Meds ordered this encounter  Medications  . montelukast (SINGULAIR) 10 MG tablet    Sig: Take 1 tablet (10 mg total) by mouth at bedtime.    Dispense:  30 tablet    Refill:  5   Other allergy screening: Food allergy: no Medication allergy: yes  Topamax - nausea, vomiting Hymenoptera allergy: no Urticaria: no Eczema: yes History of recurrent infections suggestive of immunodeficency: no  Diagnostics: Spirometry:  Tracings reviewed. Her effort: Good reproducible efforts. FVC: 2.53L FEV1: 2.01L, 86% predicted FEV1/FVC ratio: 79% Interpretation: Spirometry consistent with normal pattern and no improvement in FEV1 post bronchodilator treatment.  Please see scanned spirometry results for details.  Skin Testing: Environmental allergy panel and select foods. Positive test to: dust mites and cockroaches.  Results discussed with patient/family. Airborne Adult Perc - 12/10/19 1413    Time Antigen Placed  1413    Allergen Manufacturer  Lavella Hammock    Location  Back    Number of Test  59    Panel 1  Select    1. Control-Buffer 50% Glycerol  Negative    2. Control-Histamine 1 mg/ml  2+    3. Albumin saline  Negative    4. Beecher  Negative    5. Guatemala  Negative    6. Johnson  Negative    7. [place] Blue  Negative    8. Meadow Fescue  Negative    9. Perennial Rye  Negative    10. Sweet Vernal  Negative    11. Timothy  Negative    12. Cocklebur  Negative    13. Burweed Marshelder  Negative    14. Ragweed, short  Negative    15. Ragweed, Giant  Negative    16. Plantain,  English  Negative    17. Lamb's Quarters  Negative    18. Sheep Sorrell  Negative    19. Rough Pigweed  Negative    20. Marsh Elder, Rough  Negative    21. Mugwort, Common  Negative    22. Ash mix  Negative    23. Birch mix   Negative    24. Beech American  Negative    25. Box, Elder  Negative    26. Cedar, red  Negative    27. Cottonwood, [place]  Negative    28. Elm mix  Negative    29. Hickory mix  Negative    30. Maple mix  Negative    31. Oak, [place] mix  Negative    32. Pecan Pollen  Negative    33. Pine mix  Negative    34. Sycamore Eastern  Negative    35. Maplewood, Black Pollen  Negative    36. Alternaria alternata  Negative    37. Cladosporium Herbarum  Negative    38. Aspergillus mix  Negative    39. Penicillium mix  Negative    40. Bipolaris sorokiniana (Helminthosporium)  Negative    41. Drechslera spicifera (Curvularia)  Negative    42. Mucor plumbeus  Negative    43. Fusarium moniliforme  Negative    44. Aureobasidium pullulans (pullulara)  Negative    45. Rhizopus oryzae  Negative    46.  Botrytis cinera  Negative    47. Epicoccum nigrum  Negative    48. Phoma betae  Negative    49. Candida Albicans  Negative    50. Trichophyton mentagrophytes  Negative    51. Mite, D Farinae  5,000 AU/ml  2+    52. Mite, D Pteronyssinus  5,000 AU/ml  2+    53. Cat Hair 10,000 BAU/ml  Negative    54.  Dog Epithelia  Negative    55. Mixed Feathers  Negative    56. Horse Epithelia  Negative    57. Cockroach, German  Negative    58. Mouse  Negative    59. Tobacco Leaf  Negative     Food Perc - 12/10/19 1414    Time Antigen Placed  1414    Allergen Manufacturer  Lavella Hammock    Location  Back    Number of allergen test  10    Food  Select    1. Peanut  Negative    2. Soybean food  Negative    3. Wheat, whole  Negative    4. Sesame  Negative    5. Milk, cow  Negative    6. Egg White, chicken  Negative    7. Casein  Negative    8. Shellfish mix  Negative    9. Fish mix  Negative    10. Cashew  Negative     Intradermal - 12/10/19 1436    Time Antigen Placed  1436    Allergen Manufacturer  Lavella Hammock    Location  Arm    Number of Test  14    Intradermal  Select    Control  Negative    Guatemala   Negative    Johnson  Negative    7 Grass  Negative    Ragweed mix  Negative    Weed mix  Negative    Tree mix  Negative    Mold 1  Negative    Mold 2  Negative    Mold 3  Negative    Mold 4  Negative    Cat  Negative    Dog  Negative    Cockroach  3+       Past Medical History: Patient Active Problem List   Diagnosis Date Noted  . Vitamin D deficiency 08/15/2019  . Anxiety and depression 08/15/2019  . History of Helicobacter pylori infection 08/15/2019  . Seasonal allergies 04/24/2018  . Gastroesophageal reflux disease without esophagitis 07/12/2017  . Acute upper respiratory infection 12/29/2016  . Coughing 12/29/2016  . Work-related stress 06/08/2014  . Other allergic rhinitis 06/08/2014  . Chronic pain of left knee 05/16/2012  . Migraine headache 12/02/2011   Past Medical History:  Diagnosis Date  . Allergy   . Anxiety   . Anxiety and depression 08/15/2019  . Asthma   . Carpal tunnel syndrome during pregnancy   . History of Helicobacter pylori infection 08/15/2019  . Hx of varicella   . Migraines   . Postpartum care following vaginal delivery (11/4) 07/26/2015  . Vaginal Pap smear, abnormal   . Vitamin D deficiency 08/15/2019   Past Surgical History: Past Surgical History:  Procedure Laterality Date  . [place] STUDY N/A 07/02/2019   Procedure: [place] STUDY;  Surgeon: Irene Shipper, MD;  Location: [facility];  Service: Endoscopy;  Laterality: N/A;  . DILATION AND EVACUATION N/A 08/20/2014   Procedure: DILATATION AND EVACUATION;  Surgeon: Princess Bruins, MD;  Location: [facility]  ORS;  Service: Gynecology;  Laterality: N/A;  . ESOPHAGEAL MANOMETRY N/A 07/02/2019   Procedure: ESOPHAGEAL MANOMETRY (EM);  Surgeon: Irene Shipper, MD;  Location: [facility];  Service: Endoscopy;  Laterality: N/A;  . [place] IMPEDANCE STUDY N/A 07/02/2019   Procedure: [place] IMPEDANCE STUDY;  Surgeon: Irene Shipper, MD;  Location: [facility];  Service: Endoscopy;  Laterality: N/A;  .  WISDOM TOOTH EXTRACTION     Medication List:  Current Outpatient Medications  Medication Sig Dispense Refill  . albuterol (VENTOLIN HFA) 108 (90 Base) MCG/ACT inhaler Inhale 2 puffs into the lungs every 6 (six) hours as needed for wheezing or shortness of breath. 18 g 0  . cetirizine (ZYRTEC) 10 MG tablet Take 10 mg by mouth daily.    . fluticasone (FLONASE) 50 MCG/ACT nasal spray Place 2 sprays into both nostrils daily.    Marland Kitchen PARoxetine (PAXIL) 30 MG tablet Take 1 tablet (30 mg total) by mouth daily. 30 tablet 3  . traZODone (DESYREL) 50 MG tablet Take 0.5-1 tablets (25-50 mg total) by mouth at bedtime as needed for sleep. 30 tablet 3  . Vitamin D, Ergocalciferol, (DRISDOL) 1.25 MG (50000 UT) CAPS capsule Take 1 capsule (50,000 Units total) by mouth every 7 (seven) days. 5 capsule 3  . clonazePAM (KLONOPIN) 0.5 MG tablet Take 1 tablet (0.5 mg total) by mouth 2 (two) times daily as needed for anxiety. 45 tablet 1  . metroNIDAZOLE (FLAGYL) 250 MG tablet Take 1 tablet (250 mg total) by mouth 3 (three) times daily. (Patient not taking: Reported on 08/01/2019) 42 tablet 0  . montelukast (SINGULAIR) 10 MG tablet Take 1 tablet (10 mg total) by mouth at bedtime. 30 tablet 5  . nystatin (MYCOSTATIN) 100000 UNIT/ML suspension Take 5 mLs (500,000 Units total) by mouth 4 (four) times daily. 60 mL 0  . rizatriptan (MAXALT) 10 MG tablet Take 1 tablet (10 mg total) by mouth as needed for migraine. May repeat in 2 hours if needed 10 tablet 5   No current facility-administered medications for this visit.   Allergies: Allergies  Allergen Reactions  . Topamax Nausea And Vomiting   Social History: Social History   Socioeconomic History  . Marital status: Married    Spouse name: Not on file  . Number of children: Not on file  . Years of education: college  . Highest education level: Not on file  Occupational History  . Occupation: Radiation protection practitioner  Tobacco Use  . Smoking status: Never  Smoker  . Smokeless tobacco: Never Used  Substance and Sexual Activity  . Alcohol use: Yes    Comment: socially-wine but none with pregnancy  . Drug use: No  . Sexual activity: Not on file    Comment: approx [redacted] wks gestation  Other Topics Concern  . Not on file  Social History Narrative  . Not on file   Social Determinants of Health   Financial Resource Strain:   . Difficulty of Paying Living Expenses:   Food Insecurity:   . Worried About Charity fundraiser in the Last Year:   . Arboriculturist in the Last Year:   Transportation Needs:   . Film/video editor (Medical):   Marland Kitchen Lack of Transportation (Non-Medical):   Physical Activity:   . Days of Exercise per Week:   . Minutes of Exercise per Session:   Stress:   . Feeling of Stress :   Social Connections:   . Frequency of Communication with Friends and  Family:   . Frequency of Social Gatherings with Friends and Family:   . Attends Religious Services:   . Active Member of Clubs or Organizations:   . Attends Archivist Meetings:   Marland Kitchen Marital Status:    Lives in a 41 year old home. Smoking: denies Occupation: Therapist, music HistoryFreight forwarder in the house: no Charity fundraiser in the family room: no Carpet in the bedroom: yes Heating: gas Cooling: central Pet: no  Family History: Family History  Problem Relation Age of Onset  . Cancer Mother 17       breast  . Cancer Father 66       prostate  . Stroke Maternal Grandfather   . Cancer Maternal Grandfather        colon- 86s  . Cancer Paternal Grandfather   . Heart attack Paternal Grandmother   . Cancer Maternal Aunt        Breast and lung  . Cancer Maternal Uncle        Lung  . Down syndrome Paternal Aunt    Problem                               Relation Asthma                                   Son  Eczema                                Son  Food allergy                          No  Allergic rhino conjunctivitis     Son   Review  of Systems  Constitutional: Negative for appetite change, chills, fever and unexpected weight change.  HENT: Positive for congestion and rhinorrhea.   Eyes: Positive for itching.  Respiratory: Positive for cough. Negative for chest tightness, shortness of breath and wheezing.   Cardiovascular: Negative for chest pain.  Gastrointestinal: Negative for abdominal pain.  Genitourinary: Negative for difficulty urinating.  Skin: Negative for rash.  Allergic/Immunologic: Positive for environmental allergies. Negative for food allergies.  Neurological: Positive for headaches.   Objective: BP 124/90 (BP Location: Left Arm, Patient Position: Sitting, Cuff Size: Normal)   Pulse 82   Temp 98.2 F (36.8 C) (Temporal)   Ht 5\' 2"  (1.575 m)   Wt 142 lb (64.4 kg)   SpO2 97%   BMI 25.97 kg/m  Body mass index is 25.97 kg/m. Physical Exam  Constitutional: She is oriented to person, place, and time. She appears well-developed and well-nourished.  HENT:  Head: Normocephalic and atraumatic.  Nose: Nose normal.  Mouth/Throat: Oropharynx is clear and moist.  B/l cerumen impaction  Eyes: Conjunctivae and EOM are normal.  Cardiovascular: Normal rate, regular rhythm and normal heart sounds. Exam reveals no gallop and no friction rub.  No murmur heard. Pulmonary/Chest: Effort normal and breath sounds normal. She has no wheezes. She has no rales.  Abdominal: Soft.  Musculoskeletal:     Cervical back: Neck supple.  Neurological: She is alert and oriented to person, place, and time.  Skin: Skin is warm. No rash noted.  Psychiatric: She has a normal mood and affect. Her behavior is normal.  Nursing note and vitals  reviewed.  The plan was reviewed with the patient/family, and all questions/concerned were addressed.  It was my pleasure to see Avalee today and participate in her care. Please feel free to contact me with any questions or concerns.  Sincerely,  Rexene Alberts, DO Allergy &  Immunology  [facility] office: [phone] [facility] office: [place] office: [phone]

## 2019-12-10 NOTE — Patient Instructions (Addendum)
Today' skin testing showed: Positive to dust mites, cockroaches.    Start environmental control measures.   Restart Singulair (montelukast) 10mg  daily at night.  May use over the counter antihistamines such as Zyrtec (cetirizine), Claritin (loratadine), Allegra (fexofenadine), or Xyzal (levocetirizine) daily as needed.  May use Flonase 1-2 sprays per nostril as needed.   Today's breathing test showed normal with no improvement after breathing treatment.   May use albuterol rescue inhaler 2 puffs or nebulizer every 4 to 6 hours as needed for shortness of breath, chest tightness, coughing, and wheezing. May use albuterol rescue inhaler 2 puffs 5 to 15 minutes prior to strenuous physical activities. Monitor frequency of use.   Follow up in 3 months or sooner if needed.   Control of House Dust Mite Allergen . Dust mite allergens are a common trigger of allergy and asthma symptoms. While they can be found throughout the house, these microscopic creatures thrive in warm, humid environments such as bedding, upholstered furniture and carpeting. . Because so much time is spent in the bedroom, it is essential to reduce mite levels there.  . Encase pillows, mattresses, and box springs in special allergen-proof fabric covers or airtight, zippered plastic covers.  . Bedding should be washed weekly in hot water (130 F) and dried in a hot dryer. Allergen-proof covers are available for comforters and pillows that can't be regularly washed.  Wendee Copp the allergy-proof covers every few months. Minimize clutter in the bedroom. Keep pets out of the bedroom.  Marland Kitchen Keep humidity less than 50% by using a dehumidifier or air conditioning. You can buy a humidity measuring device called a hygrometer to monitor this.  . If possible, replace carpets with hardwood, linoleum, or washable area rugs. If that's not possible, vacuum frequently with a vacuum that has a HEPA filter. . Remove all upholstered furniture and  non-washable window drapes from the bedroom. . Remove all non-washable stuffed toys from the bedroom.  Wash stuffed toys weekly.  Cockroach Allergen Avoidance Cockroaches are often found in the homes of densely populated urban areas, schools or commercial buildings, but these creatures can lurk almost anywhere. This does not mean that you have a dirty house or living area. . Block all areas where roaches can enter the home. This includes crevices, wall cracks and windows.  . Cockroaches need water to survive, so fix and seal all leaky faucets and pipes. Have an exterminator go through the house when your family and pets are gone to eliminate any remaining roaches. Marland Kitchen Keep food in lidded containers and put pet food dishes away after your pets are done eating. Vacuum and sweep the floor after meals, and take out garbage and recyclables. Use lidded garbage containers in the kitchen. Wash dishes immediately after use and clean under stoves, refrigerators or toasters where crumbs can accumulate. Wipe off the stove and other kitchen surfaces and cupboards regularly.

## 2019-12-11 ENCOUNTER — Encounter: Payer: Self-pay | Admitting: Allergy

## 2019-12-11 NOTE — Assessment & Plan Note (Addendum)
Dry coughing with posttussive emesis for the past 2 years.  Patient was seen by pulmonology and gastroenterology.  Work-up for reflux was negative and treated for H. pylori.  Chest x-ray was normal.  Started on Singulair with good benefit.  Using albuterol once a week with some benefit.  Main triggers are exertion and after eating.  Today' skin testing showed: Positive to dust mites, cockroaches.   Today's breathing test showed normal with no improvement after breathing treatment.   Even though spirometry did not show any abnormalities there is some concern for possible reactive airway disease contributed by her environmental allergens as symptoms improved after Singulair.  Start environmental control measures.   Restart Singulair (montelukast) 10mg  daily at night.   May use albuterol rescue inhaler 2 puffs or nebulizer every 4 to 6 hours as needed for shortness of breath, chest tightness, coughing, and wheezing. May use albuterol rescue inhaler 2 puffs 5 to 15 minutes prior to strenuous physical activities. Monitor frequency of use.   If above regimen does not control symptoms then will try ICS inhaler next.

## 2019-12-11 NOTE — Assessment & Plan Note (Signed)
Rhinoconjunctivitis symptoms for the past 10 years with weather changes.  Tried Zyrtec and Singulair with benefit.  No previous ENT or allergy evaluation.  Today's skin testing was positive to dust mites and cockroaches.  Start environmental control measures.   Restart Singulair (montelukast) 10mg  daily at night.  May use over the counter antihistamines such as Zyrtec (cetirizine), Claritin (loratadine), Allegra (fexofenadine), or Xyzal (levocetirizine) daily as needed.  May use Flonase 1-2 sprays per nostril as needed.

## 2020-03-12 ENCOUNTER — Ambulatory Visit: Payer: 59 | Admitting: Allergy

## 2020-04-13 NOTE — Progress Notes (Deleted)
Follow Up Note  RE: Kathleen Conway MRN: [id number] DOB: [birth year] Date of Office Visit: 04/14/2020  Referring provider: Wendall Mola, NP Primary care provider: Wendall Mola, NP  Chief Complaint: No chief complaint on file.  History of Present Illness: I had the pleasure of seeing Kathleen Conway for a follow up visit at the [facility] on 04/13/2020. She is a 39 y.o. female, who is being followed for coughing and allergic rhinitis. Her previous allergy office visit was on 12/10/2019 with Dr. Maudie Mercury. Today is a regular follow up visit.  Coughing Dry coughing with posttussive emesis for the past 2 years.  Patient was seen by pulmonology and gastroenterology.  Work-up for reflux was negative and treated for H. pylori.  Chest x-ray was normal.  Started on Singulair with good benefit.  Using albuterol once a week with some benefit.  Main triggers are exertion and after eating.  Today' skin testing showed: Positive to dust mites, cockroaches.   Today's breathing test showed normal with no improvement after breathing treatment.   Even though spirometry did not show any abnormalities there is some concern for possible reactive airway disease contributed by her environmental allergens as symptoms improved after Singulair.  Start environmental control measures.   Restart Singulair (montelukast) 10mg  daily at night.   May use albuterol rescue inhaler 2 puffs or nebulizer every 4 to 6 hours as needed for shortness of breath, chest tightness, coughing, and wheezing. May use albuterol rescue inhaler 2 puffs 5 to 15 minutes prior to strenuous physical activities. Monitor frequency of use.   If above regimen does not control symptoms then will try ICS inhaler next.  Other allergic rhinitis Rhinoconjunctivitis symptoms for the past 10 years with weather changes.  Tried Zyrtec and Singulair with benefit.  No previous ENT or allergy evaluation.  Today's skin  testing was positive to dust mites and cockroaches.  Start environmental control measures.   Restart Singulair (montelukast) 10mg  daily at night.  May use over the counter antihistamines such as Zyrtec (cetirizine), Claritin (loratadine), Allegra (fexofenadine), or Xyzal (levocetirizine) daily as needed.  May use Flonase 1-2 sprays per nostril as needed.   Return in about 3 months (around 03/11/2020).   Assessment and Plan: Kathleen Conway is a 39 y.o. female with: No problem-specific Assessment & Plan notes found for this encounter.  No follow-ups on file.  No orders of the defined types were placed in this encounter.  Lab Orders  No laboratory test(s) ordered today    Diagnostics: Spirometry:  Tracings reviewed. Her effort: {Blank single:19197::"Good reproducible efforts.","It was hard to get consistent efforts and there is a question as to whether this reflects a maximal maneuver.","Poor effort, data can not be interpreted."} FVC: ***L FEV1: ***L, ***% predicted FEV1/FVC ratio: ***% Interpretation: {Blank single:19197::"Spirometry consistent with mild obstructive disease","Spirometry consistent with moderate obstructive disease","Spirometry consistent with severe obstructive disease","Spirometry consistent with possible restrictive disease","Spirometry consistent with mixed obstructive and restrictive disease","Spirometry uninterpretable due to technique","Spirometry consistent with normal pattern","No overt abnormalities noted given today's efforts"}.  Please see scanned spirometry results for details.  Skin Testing: {Blank single:19197::"Select foods","Environmental allergy panel","Environmental allergy panel and select foods","Food allergy panel","None","Deferred due to recent antihistamines use"}. Positive test to: ***. Negative test to: ***.  Results discussed with patient/family.   Medication List:  Current Outpatient Medications  Medication Sig Dispense Refill    albuterol (VENTOLIN HFA) 108 (90 Base) MCG/ACT inhaler Inhale 2 puffs into the lungs every 6 (six) hours as needed  for wheezing or shortness of breath. 18 g 0   cetirizine (ZYRTEC) 10 MG tablet Take 10 mg by mouth daily.     clonazePAM (KLONOPIN) 0.5 MG tablet Take 1 tablet (0.5 mg total) by mouth 2 (two) times daily as needed for anxiety. 45 tablet 1   fluticasone (FLONASE) 50 MCG/ACT nasal spray Place 2 sprays into both nostrils daily.     metroNIDAZOLE (FLAGYL) 250 MG tablet Take 1 tablet (250 mg total) by mouth 3 (three) times daily. (Patient not taking: Reported on 08/01/2019) 42 tablet 0   montelukast (SINGULAIR) 10 MG tablet Take 1 tablet (10 mg total) by mouth at bedtime. 30 tablet 5   nystatin (MYCOSTATIN) 100000 UNIT/ML suspension Take 5 mLs (500,000 Units total) by mouth 4 (four) times daily. 60 mL 0   PARoxetine (PAXIL) 30 MG tablet Take 1 tablet (30 mg total) by mouth daily. 30 tablet 3   rizatriptan (MAXALT) 10 MG tablet Take 1 tablet (10 mg total) by mouth as needed for migraine. May repeat in 2 hours if needed 10 tablet 5   traZODone (DESYREL) 50 MG tablet Take 0.5-1 tablets (25-50 mg total) by mouth at bedtime as needed for sleep. 30 tablet 3   Vitamin D, Ergocalciferol, (DRISDOL) 1.25 MG (50000 UT) CAPS capsule Take 1 capsule (50,000 Units total) by mouth every 7 (seven) days. 5 capsule 3   No current facility-administered medications for this visit.   Allergies: Allergies  Allergen Reactions   Topamax Nausea And Vomiting   I reviewed her past medical history, social history, family history, and environmental history and no significant changes have been reported from her previous visit.  Review of Systems  Constitutional: Negative for appetite change, chills, fever and unexpected weight change.  HENT: Positive for congestion and rhinorrhea.   Eyes: Positive for itching.  Respiratory: Positive for cough. Negative for chest tightness, shortness of breath and  wheezing.   Cardiovascular: Negative for chest pain.  Gastrointestinal: Negative for abdominal pain.  Genitourinary: Negative for difficulty urinating.  Skin: Negative for rash.  Allergic/Immunologic: Positive for environmental allergies. Negative for food allergies.  Neurological: Positive for headaches.   Objective: There were no vitals taken for this visit. There is no height or weight on file to calculate BMI. Physical Exam Vitals and nursing note reviewed.  Constitutional:      Appearance: She is well-developed.  HENT:     Head: Normocephalic and atraumatic.     Nose: Nose normal.  Eyes:     Conjunctiva/sclera: Conjunctivae normal.  Cardiovascular:     Rate and Rhythm: Normal rate and regular rhythm.     Heart sounds: Normal heart sounds. No murmur heard.  No friction rub. No gallop.   Pulmonary:     Effort: Pulmonary effort is normal.     Breath sounds: Normal breath sounds. No wheezing or rales.  Abdominal:     Palpations: Abdomen is soft.  Musculoskeletal:     Cervical back: Neck supple.  Skin:    General: Skin is warm.     Findings: No rash.  Neurological:     Mental Status: She is alert and oriented to person, place, and time.  Psychiatric:        Behavior: Behavior normal.    Previous notes and tests were reviewed. The plan was reviewed with the patient/family, and all questions/concerned were addressed.  It was my pleasure to see Oluwademilade today and participate in her care. Please feel free to contact me with any questions  or concerns.  Sincerely,  Rexene Alberts, DO Allergy & Immunology  [facility] office: [phone] [facility] office: [place] office: [phone]

## 2020-04-14 ENCOUNTER — Encounter: Payer: Self-pay | Admitting: Allergy

## 2020-04-14 ENCOUNTER — Ambulatory Visit: Payer: 59 | Admitting: Allergy

## 2020-04-14 ENCOUNTER — Other Ambulatory Visit: Payer: Self-pay

## 2020-04-14 VITALS — BP 122/62 | HR 80 | Resp 18 | Ht 62.0 in

## 2020-04-14 DIAGNOSIS — R05 Cough: Secondary | ICD-10-CM

## 2020-04-14 DIAGNOSIS — J3089 Other allergic rhinitis: Secondary | ICD-10-CM

## 2020-04-14 DIAGNOSIS — R059 Cough, unspecified: Secondary | ICD-10-CM

## 2020-04-14 NOTE — Assessment & Plan Note (Addendum)
Past history - Dry coughing with posttussive emesis for the past 2 years.  Patient was seen by pulmonology and gastroenterology.  Work-up for reflux was negative and treated for H. pylori.  Chest x-ray was normal.  Started on Singulair with good benefit.  Using albuterol once a week with some benefit.  Main triggers are exertion and after eating. 2021 skin testing showed: Positive to dust mites, cockroaches. 2021 breathing test showed normal with no improvement after breathing treatment.  Interim history - coughing resolved with Singulair. No albuterol use.  May use albuterol rescue inhaler 2 puffs or nebulizer every 4 to 6 hours as needed for shortness of breath, chest tightness, coughing, and wheezing. May use albuterol rescue inhaler 2 puffs 5 to 15 minutes prior to strenuous physical activities. Monitor frequency of use.   Continue Singulair 10 mg nightly.   Continue environmental measures.

## 2020-04-14 NOTE — Progress Notes (Signed)
Follow Up Note  RE: Kathleen Conway MRN: [id number] DOB: 01/12/1981 Date of Office Visit: 04/14/2020  Referring provider: Wendall Mola, NP Primary care provider: Wendall Mola, NP  Chief Complaint: Allergic Rhinitis   History of Present Illness: I had the pleasure of seeing Kathleen Conway for a follow up visit at the [facility] [place] on 04/14/2020. She is a 39 y.o. female, who is being followed for cough and allergic Rhinitis. Her previous allergy office visit was on 12/11/2019 with Dr. Maudie Mercury. Today is a regular follow up visit.  Coughing:  Her cough has resolved since restarting Singulair.  She has not had to utilize her inhaler treatment since her last visit. She tolerates Singulair with no reported side effects.   Other Allergic Rhinitis:  Patient reports resolution of her symptoms since starting Singulair. Has not used antihistamine medications or nasal sprays since last visit. She has been able to go outside, with a recent vacation to the beach without exacerbation of her symptoms.   Assessment and Plan: Kathleen Conway is a 39 y.o. female with: Other allergic rhinitis Past history - Rhinoconjunctivitis symptoms for the past 10 years with weather changes.  Tried Zyrtec and Singulair with benefit.  No previous ENT eval. 2021 skin testing was positive to dust mites and cockroaches. Interim history - stable with Singulair only.  Continue environmental control measures.   Continue Singulair (montelukast) 10mg  daily at night.  May use over the counter antihistamines such as Zyrtec (cetirizine), Claritin (loratadine), Allegra (fexofenadine), or Xyzal (levocetirizine) daily as needed.  May use Flonase 1-2 sprays per nostril as needed.   Coughing Past history - Dry coughing with posttussive emesis for the past 2 years.  Patient was seen by pulmonology and gastroenterology.  Work-up for reflux was negative and treated for H. pylori.  Chest x-ray was  normal.  Started on Singulair with good benefit.  Using albuterol once a week with some benefit.  Main triggers are exertion and after eating. 2021 skin testing showed: Positive to dust mites, cockroaches. 2021 breathing test showed normal with no improvement after breathing treatment.  Interim history - coughing resolved with Singulair. No albuterol use.  May use albuterol rescue inhaler 2 puffs or nebulizer every 4 to 6 hours as needed for shortness of breath, chest tightness, coughing, and wheezing. May use albuterol rescue inhaler 2 puffs 5 to 15 minutes prior to strenuous physical activities. Monitor frequency of use.   Continue Singulair 10 mg nightly.   Continue environmental measures.   Return in about 6 months (around 10/15/2020).   Medication List:  Current Outpatient Medications  Medication Sig Dispense Refill  . clonazePAM (KLONOPIN) 0.5 MG tablet Take 1 tablet (0.5 mg total) by mouth 2 (two) times daily as needed for anxiety. 45 tablet 1  . ketoconazole (NIZORAL) 2 % cream SMARTSIG:Sparingly Topical 1 to 2 Times Daily    . montelukast (SINGULAIR) 10 MG tablet Take 1 tablet (10 mg total) by mouth at bedtime. 30 tablet 5  . rizatriptan (MAXALT) 10 MG tablet Take 1 tablet (10 mg total) by mouth as needed for migraine. May repeat in 2 hours if needed 10 tablet 5  . spironolactone (ALDACTONE) 50 MG tablet Take by mouth.    . tretinoin (RETIN-A) 0.05 % cream SMARTSIG:Sparingly Topical Every Night    . Vitamin D, Ergocalciferol, (DRISDOL) 1.25 MG (50000 UT) CAPS capsule Take 1 capsule (50,000 Units total) by mouth every 7 (seven) days. 5 capsule 3  . albuterol (  VENTOLIN HFA) 108 (90 Base) MCG/ACT inhaler Inhale 2 puffs into the lungs every 6 (six) hours as needed for wheezing or shortness of breath. (Patient not taking: Reported on 04/14/2020) 18 g 0  . cetirizine (ZYRTEC) 10 MG tablet Take 10 mg by mouth daily. (Patient not taking: Reported on 04/14/2020)    . fluticasone (FLONASE) 50  MCG/ACT nasal spray Place 2 sprays into both nostrils daily. (Patient not taking: Reported on 04/14/2020)     No current facility-administered medications for this visit.   Allergies: Allergies  Allergen Reactions  . Topamax Nausea And Vomiting   I reviewed her past medical history, social history, family history, and environmental history and no significant changes have been reported from her previous visit.  Review of Systems  Constitutional: Negative for appetite change, chills, fever and unexpected weight change.  HENT: Negative for congestion and rhinorrhea.   Eyes: Negative for itching.  Respiratory: Negative for cough, chest tightness, shortness of breath and wheezing.   Gastrointestinal: Negative for abdominal pain.  Skin: Negative for rash.  Allergic/Immunologic: Positive for environmental allergies.  Neurological: Negative for headaches.   Objective: BP (!) 122/62   Pulse 80   Resp 18   Ht 5\' 2"  (1.575 m)   SpO2 98%   BMI 25.97 kg/m  Body mass index is 25.97 kg/m. Physical Exam Vitals and nursing note reviewed.  Constitutional:      Appearance: Normal appearance. She is well-developed.  HENT:     Head: Normocephalic and atraumatic.     Right Ear: Tympanic membrane and external ear normal.     Left Ear: Tympanic membrane and external ear normal.     Nose: Nose normal.     Mouth/Throat:     Mouth: Mucous membranes are moist.     Pharynx: Oropharynx is clear.  Eyes:     Conjunctiva/sclera: Conjunctivae normal.  Cardiovascular:     Rate and Rhythm: Normal rate and regular rhythm.     Heart sounds: Normal heart sounds. No murmur heard.   Pulmonary:     Effort: Pulmonary effort is normal.     Breath sounds: Normal breath sounds. No wheezing, rhonchi or rales.  Musculoskeletal:     Cervical back: Neck supple.  Skin:    General: Skin is warm.     Findings: No rash.  Neurological:     Mental Status: She is alert and oriented to person, place, and time.    Psychiatric:        Behavior: Behavior normal.    Previous notes and tests were reviewed. The plan was reviewed with the patient/family, and all questions/concerned were addressed.  It was my pleasure to see Kathleen Conway today and participate in her care. Please feel free to contact me with any questions or concerns.  Sincerely,  Maudie Mercury, MD IMTS, PGY-2 Pager: [phone] 04/14/2020,5:48 PM  I performed a history and physical examination of the patient and discussed her management with resident Maudie Mercury, MD. I reviewed the resident's note and agree with the documented findings and plan of care. The note in its entirety was edited by myself, including the physical exam, assessment, and plan.   Rexene Alberts, DO Allergy & Immunology  [facility] office: [phone] [facility] office: [place] office: [phone]

## 2020-04-14 NOTE — Patient Instructions (Addendum)
Past skin testing showed: Positive to dust mites, cockroaches.   Continue environmental control measures.   Continue Singulair (montelukast) 10mg  daily at night.  May use over the counter antihistamines such as Zyrtec (cetirizine), Claritin (loratadine), Allegra (fexofenadine), or Xyzal (levocetirizine) daily as needed.  May use Flonase 1-2 sprays per nostril as needed.    May use albuterol rescue inhaler 2 puffs or nebulizer every 4 to 6 hours as needed for shortness of breath, chest tightness, coughing, and wheezing. May use albuterol rescue inhaler 2 puffs 5 to 15 minutes prior to strenuous physical activities. Monitor frequency of use.   Follow up in 6 months or sooner if needed.   Control of House Dust Mite Allergen . Dust mite allergens are a common trigger of allergy and asthma symptoms. While they can be found throughout the house, these microscopic creatures thrive in warm, humid environments such as bedding, upholstered furniture and carpeting. . Because so much time is spent in the bedroom, it is essential to reduce mite levels there.  . Encase pillows, mattresses, and box springs in special allergen-proof fabric covers or airtight, zippered plastic covers.  . Bedding should be washed weekly in hot water (130 F) and dried in a hot dryer. Allergen-proof covers are available for comforters and pillows that can't be regularly washed.  Wendee Copp the allergy-proof covers every few months. Minimize clutter in the bedroom. Keep pets out of the bedroom.  Marland Kitchen Keep humidity less than 50% by using a dehumidifier or air conditioning. You can buy a humidity measuring device called a hygrometer to monitor this.  . If possible, replace carpets with hardwood, linoleum, or washable area rugs. If that's not possible, vacuum frequently with a vacuum that has a HEPA filter. . Remove all upholstered furniture and non-washable window drapes from the bedroom. . Remove all non-washable stuffed toys from the  bedroom.  Wash stuffed toys weekly.  Cockroach Allergen Avoidance Cockroaches are often found in the homes of densely populated urban areas, schools or commercial buildings, but these creatures can lurk almost anywhere. This does not mean that you have a dirty house or living area. . Block all areas where roaches can enter the home. This includes crevices, wall cracks and windows.  . Cockroaches need water to survive, so fix and seal all leaky faucets and pipes. Have an exterminator go through the house when your family and pets are gone to eliminate any remaining roaches. Marland Kitchen Keep food in lidded containers and put pet food dishes away after your pets are done eating. Vacuum and sweep the floor after meals, and take out garbage and recyclables. Use lidded garbage containers in the kitchen. Wash dishes immediately after use and clean under stoves, refrigerators or toasters where crumbs can accumulate. Wipe off the stove and other kitchen surfaces and cupboards regularly.

## 2020-04-14 NOTE — Assessment & Plan Note (Addendum)
Past history - Rhinoconjunctivitis symptoms for the past 10 years with weather changes.  Tried Zyrtec and Singulair with benefit.  No previous ENT eval. 2021 skin testing was positive to dust mites and cockroaches. Interim history - stable with Singulair only.  Continue environmental control measures.   Continue Singulair (montelukast) 10mg  daily at night.  May use over the counter antihistamines such as Zyrtec (cetirizine), Claritin (loratadine), Allegra (fexofenadine), or Xyzal (levocetirizine) daily as needed.  May use Flonase 1-2 sprays per nostril as needed.

## 2020-05-12 ENCOUNTER — Other Ambulatory Visit: Payer: Self-pay | Admitting: *Deleted

## 2020-05-12 MED ORDER — MONTELUKAST SODIUM 10 MG PO TABS: 10.0000 mg | ORAL_TABLET | Freq: Every day | ORAL | 1 refills | Status: DC

## 2020-10-15 ENCOUNTER — Ambulatory Visit: Payer: 59 | Admitting: Family Medicine

## 2020-10-15 ENCOUNTER — Ambulatory Visit: Payer: 59 | Admitting: Allergy

## 2020-10-15 ENCOUNTER — Other Ambulatory Visit: Payer: Self-pay

## 2020-10-15 ENCOUNTER — Encounter: Payer: Self-pay | Admitting: Family Medicine

## 2020-10-15 VITALS — BP 108/63 | HR 69 | Temp 98.0°F | Ht 62.0 in | Wt 138.0 lb

## 2020-10-15 DIAGNOSIS — Z13220 Encounter for screening for lipoid disorders: Secondary | ICD-10-CM | POA: Diagnosis not present

## 2020-10-15 DIAGNOSIS — F419 Anxiety disorder, unspecified: Secondary | ICD-10-CM

## 2020-10-15 DIAGNOSIS — Z13228 Encounter for screening for other metabolic disorders: Secondary | ICD-10-CM | POA: Diagnosis not present

## 2020-10-15 DIAGNOSIS — Z1159 Encounter for screening for other viral diseases: Secondary | ICD-10-CM

## 2020-10-15 DIAGNOSIS — E559 Vitamin D deficiency, unspecified: Secondary | ICD-10-CM

## 2020-10-15 DIAGNOSIS — F32A Depression, unspecified: Secondary | ICD-10-CM

## 2020-10-15 DIAGNOSIS — G43019 Migraine without aura, intractable, without status migrainosus: Secondary | ICD-10-CM

## 2020-10-15 DIAGNOSIS — Z1231 Encounter for screening mammogram for malignant neoplasm of breast: Secondary | ICD-10-CM

## 2020-10-15 DIAGNOSIS — J3089 Other allergic rhinitis: Secondary | ICD-10-CM

## 2020-10-15 DIAGNOSIS — M25562 Pain in left knee: Secondary | ICD-10-CM

## 2020-10-15 MED ORDER — ONDANSETRON HCL 4 MG PO TABS: 4.0000 mg | ORAL_TABLET | Freq: Three times a day (TID) | ORAL | 0 refills | Status: DC | PRN

## 2020-10-15 MED ORDER — ALBUTEROL SULFATE HFA 108 (90 BASE) MCG/ACT IN AERS: 2.0000 | INHALATION_SPRAY | Freq: Four times a day (QID) | RESPIRATORY_TRACT | 0 refills | Status: DC | PRN

## 2020-10-15 MED ORDER — FLUTICASONE PROPIONATE 50 MCG/ACT NA SUSP
2.0000 | Freq: Every day | NASAL | 3 refills | Status: DC
Start: 2020-10-15 — End: 2020-12-04

## 2020-10-15 MED ORDER — SERTRALINE HCL 50 MG PO TABS: 50.0000 mg | ORAL_TABLET | Freq: Every day | ORAL | 3 refills | Status: DC

## 2020-10-15 MED ORDER — RIZATRIPTAN BENZOATE 10 MG PO TABS: 10.0000 mg | ORAL_TABLET | ORAL | 5 refills | Status: DC | PRN

## 2020-10-15 MED ORDER — CETIRIZINE HCL 10 MG PO TABS: 10.0000 mg | ORAL_TABLET | Freq: Every day | ORAL | 3 refills | Status: DC

## 2020-10-15 MED ORDER — CLONAZEPAM 0.5 MG PO TABS: 0.5000 mg | ORAL_TABLET | Freq: Two times a day (BID) | ORAL | 1 refills | Status: DC | PRN

## 2020-10-15 MED ORDER — MONTELUKAST SODIUM 10 MG PO TABS: 10.0000 mg | ORAL_TABLET | Freq: Every day | ORAL | 1 refills | Status: DC

## 2020-10-15 NOTE — Patient Instructions (Addendum)
  Health Maintenance, Female Adopting a healthy lifestyle and getting preventive care are important in promoting health and wellness. Ask your health care provider about:  The right schedule for you to have regular tests and exams.  Things you can do on your own to prevent diseases and keep yourself healthy. What should I know about diet, weight, and exercise? Eat a healthy diet  Eat a diet that includes plenty of vegetables, fruits, low-fat dairy products, and lean protein.  Do not eat a lot of foods that are high in solid fats, added sugars, or sodium.   Maintain a healthy weight Body mass index (BMI) is used to identify weight problems. It estimates body fat based on height and weight. Your health care provider can help determine your BMI and help you achieve or maintain a healthy weight. Get regular exercise Get regular exercise. This is one of the most important things you can do for your health. Most adults should:  Exercise for at least 150 minutes each week. The exercise should increase your heart rate and make you sweat (moderate-intensity exercise).  Do strengthening exercises at least twice a week. This is in addition to the moderate-intensity exercise.  Spend less time sitting. Even light physical activity can be beneficial. Watch cholesterol and blood lipids Have your blood tested for lipids and cholesterol at 42 years of age, then have this test every 5 years. Have your cholesterol levels checked more often if:  Your lipid or cholesterol levels are high.  You are older than 42 years of age.  You are at high risk for heart disease. What should I know about cancer screening? Depending on your health history and family history, you may need to have cancer screening at various ages. This may include screening for:  Breast cancer.  Cervical cancer.  Colorectal cancer.  Skin cancer.  Lung cancer. What should I know about heart disease, diabetes, and high blood  pressure? Blood pressure and heart disease  High blood pressure causes heart disease and increases the risk of stroke. This is more likely to develop in people who have high blood pressure readings, are of African descent, or are overweight.  Have your blood pressure checked: ? Every 3-5 years if you are 18-39 years of age. ? Every year if you are 40 years old or older. Diabetes Have regular diabetes screenings. This checks your fasting blood sugar level. Have the screening done:  Once every three years after age 40 if you are at a normal weight and have a low risk for diabetes.  More often and at a younger age if you are overweight or have a high risk for diabetes. What should I know about preventing infection? Hepatitis B If you have a higher risk for hepatitis B, you should be screened for this virus. Talk with your health care provider to find out if you are at risk for hepatitis B infection. Hepatitis C Testing is recommended for:  Everyone born from 1945 through 1965.  Anyone with known risk factors for hepatitis C. Sexually transmitted infections (STIs)  Get screened for STIs, including gonorrhea and chlamydia, if: ? You are sexually active and are younger than 42 years of age. ? You are older than 42 years of age and your health care provider tells you that you are at risk for this type of infection. ? Your sexual activity has changed since you were last screened, and you are at increased risk for chlamydia or gonorrhea. Ask your health   care provider if you are at risk.  Ask your health care provider about whether you are at high risk for HIV. Your health care provider may recommend a prescription medicine to help prevent HIV infection. If you choose to take medicine to prevent HIV, you should first get tested for HIV. You should then be tested every 3 months for as long as you are taking the medicine. Pregnancy  If you are about to stop having your period (premenopausal) and  you may become pregnant, seek counseling before you get pregnant.  Take 400 to 800 micrograms (mcg) of folic acid every day if you become pregnant.  Ask for birth control (contraception) if you want to prevent pregnancy. Osteoporosis and menopause Osteoporosis is a disease in which the bones lose minerals and strength with aging. This can result in bone fractures. If you are 65 years old or older, or if you are at risk for osteoporosis and fractures, ask your health care provider if you should:  Be screened for bone loss.  Take a calcium or vitamin D supplement to lower your risk of fractures.  Be given hormone replacement therapy (HRT) to treat symptoms of menopause. Follow these instructions at home: Lifestyle  Do not use any products that contain nicotine or tobacco, such as cigarettes, e-cigarettes, and chewing tobacco. If you need help quitting, ask your health care provider.  Do not use street drugs.  Do not share needles.  Ask your health care provider for help if you need support or information about quitting drugs. Alcohol use  Do not drink alcohol if: ? Your health care provider tells you not to drink. ? You are pregnant, may be pregnant, or are planning to become pregnant.  If you drink alcohol: ? Limit how much you use to 0-1 drink a day. ? Limit intake if you are breastfeeding.  Be aware of how much alcohol is in your drink. In the [place] one drink equals one 12 oz bottle of beer (355 mL), one 5 oz glass of wine (148 mL), or one 1 oz glass of hard liquor (44 mL). General instructions  Schedule regular health, dental, and eye exams.  Stay current with your vaccines.  Tell your health care provider if: ? You often feel depressed. ? You have ever been abused or do not feel safe at home. Summary  Adopting a healthy lifestyle and getting preventive care are important in promoting health and wellness.  Follow your health care provider's instructions about healthy  diet, exercising, and getting tested or screened for diseases.  Follow your health care provider's instructions on monitoring your cholesterol and blood pressure. This information is not intended to replace advice given to you by your health care provider. Make sure you discuss any questions you have with your health care provider. Document Revised: 08/30/2018 Document Reviewed: 08/30/2018 Elsevier Patient Education  2021 Elsevier Inc.   If you have lab work done today you will be contacted with your lab results within the next 2 weeks.  If you have not heard from us then please contact us. The fastest way to get your results is to register for My Chart.   IF you received an x-ray today, you will receive an invoice from [facility]. Please contact [facility] at [phone] with questions or concerns regarding your invoice.   IF you received labwork today, you will receive an invoice from [facility]. Please contact [facility] at [phone] with questions or concerns regarding your invoice.   Our billing   staff will not be able to assist you with questions regarding bills from these companies.  You will be contacted with the lab results as soon as they are available. The fastest way to get your results is to activate your My Chart account. Instructions are located on the last page of this paperwork. If you have not heard from us regarding the results in 2 weeks, please contact this office.      

## 2020-10-15 NOTE — Progress Notes (Signed)
1/26/202211:13 AM  Kathleen Conway 1980/12/14, 40 y.o., female [id number]  Chief Complaint  Patient presents with  . Transitions Of Care  . depression/ amxiety    Gad7-11 phq9 7 ran out of meds   . Knee Pain    L knee pain x 3 weeks flare up / had PT before    HPI:   Patient is a 40 y.o. 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 Hepatitis C Screening  Never done  . MAMMOGRAM  11/28/2019  . INFLUENZA VACCINE  04/20/2020  . PAP SMEAR-Modifier  12/08/2020  . TETANUS/TDAP  05/18/2021  . COVID-19 Vaccine  Completed  . HIV Screening  Completed     Depression screen [facility] 2/9 10/15/2020 08/31/2019 08/01/2019  Decreased Interest _0 Down, Depressed, Hopeless _1 PHQ - 2 Score _2 Altered sleeping _3 Tired, decreased energy _4 Change in appetite 1 3 0  Feeling bad or failure about yourself  0 1 1  Trouble concentrating 0 2 3  Moving slowly or fidgety/restless 0 2 3  Suicidal thoughts 0 0 0  PHQ-9 Score _5 Difficult doing work/chores Somewhat difficult Somewhat difficult Not difficult at all    Fall Risk  10/15/2020 08/31/2019 07/13/2018 05/31/2018 04/24/2018  Falls in the past year? 0 0 No No No  Number falls in past yr: 0 0 - - -   Injury with Fall? 0 0 - - -  Follow up Falls evaluation completed Falls evaluation completed - - -     Allergies  Allergen Reactions  . Topamax Nausea And Vomiting    Prior to Admission medications   Medication Sig Start Date End Date Taking? Authorizing Provider  albuterol (VENTOLIN HFA) 108 (90 Base) MCG/ACT inhaler Inhale 2 puffs into the lungs every 6 (six) hours as needed for wheezing or shortness of breath. Patient not taking: Reported on 04/14/2020 08/01/19   Wendall Mola, NP  cetirizine (ZYRTEC) 10 MG tablet Take 10 mg by mouth daily. Patient not taking: Reported on 04/14/2020    [provider]  clonazePAM (KLONOPIN) 0.5 MG tablet Take 1 tablet (0.5 mg total) by mouth 2 (two) times daily as needed for anxiety. 08/31/19 04/14/20  Wendall Mola, NP  fluticasone Asencion Islam) 50 MCG/ACT nasal spray Place 2 sprays into both nostrils daily. Patient not taking: Reported on 04/14/2020    [provider]  ketoconazole (NIZORAL) 2 % cream SMARTSIG:Sparingly Topical 1 to 2 Times Daily 02/28/20   [provider]  montelukast (SINGULAIR) 10 MG tablet Take 1 tablet (10 mg total) by mouth at bedtime. 05/12/20   Garnet Sierras, DO  rizatriptan (MAXALT) 10 MG tablet Take 1 tablet (10 mg total) by mouth as needed  for migraine. May repeat in 2 hours if needed 04/24/18 04/14/20  McVey, Gelene Mink, PA-C  spironolactone (ALDACTONE) 50 MG tablet Take by mouth. 03/27/20   [provider]  tretinoin (RETIN-A) 0.05 % cream SMARTSIG:Sparingly Topical Every Night 03/01/20   [provider]  Vitamin D, Ergocalciferol, (DRISDOL) 1.25 MG (50000 UT) CAPS capsule Take 1 capsule (50,000 Units total) by mouth every 7 (seven) days. 08/31/19   Wendall Mola, NP    Past Medical History:  Diagnosis Date  . Allergy   . Anxiety   . Anxiety and depression 08/15/2019  . Asthma   . Carpal tunnel syndrome during pregnancy   . Depression    Phreesia 10/12/2020  .  History of Helicobacter pylori infection 08/15/2019  . Hx of varicella   . Migraines   . Postpartum care following vaginal delivery (11/4) 07/26/2015  . Vaginal Pap smear, abnormal   . Vitamin D deficiency 08/15/2019    Past Surgical History:  Procedure Laterality Date  . [place] STUDY N/A 07/02/2019   Procedure: [place] STUDY;  Surgeon: Irene Shipper, MD;  Location: [facility];  Service: Endoscopy;  Laterality: N/A;  . DILATION AND EVACUATION N/A 08/20/2014   Procedure: DILATATION AND EVACUATION;  Surgeon: Princess Bruins, MD;  Location: [facility];  Service: Gynecology;  Laterality: N/A;  . ESOPHAGEAL MANOMETRY N/A 07/02/2019   Procedure: ESOPHAGEAL MANOMETRY (EM);  Surgeon: Irene Shipper, MD;  Location: [facility];  Service: Endoscopy;  Laterality: N/A;  . [place] IMPEDANCE STUDY N/A 07/02/2019   Procedure: [place] IMPEDANCE STUDY;  Surgeon: Irene Shipper, MD;  Location: [facility];  Service: Endoscopy;  Laterality: N/A;  . WISDOM TOOTH EXTRACTION      Social History   Tobacco Use  . Smoking status: Never Smoker  . Smokeless tobacco: Never Used  Substance Use Topics  . Alcohol use: Yes    Comment: socially-wine but none with pregnancy    Family History  Problem Relation Age of Onset  . Cancer Mother 55       breast  . Cancer Father 45       prostate  . Stroke Maternal Grandfather   . Cancer Maternal Grandfather        colon- 18s  . Cancer Paternal Grandfather   . Heart attack Paternal Grandmother   . Cancer Maternal Aunt        Breast and lung  . Cancer Maternal Uncle        Lung  . Down syndrome Paternal Aunt     Review of Systems  Respiratory: Negative for cough and shortness of breath.   Cardiovascular: Negative for chest pain and palpitations.  Gastrointestinal: Negative for abdominal pain, constipation, diarrhea, heartburn, nausea and vomiting.  Genitourinary: Negative for dysuria, flank pain, frequency, hematuria and urgency.  Musculoskeletal: Positive for  joint pain (knee pain). Negative for myalgias.  Neurological: Positive for headaches.  Psychiatric/Behavioral: Positive for depression. Negative for suicidal ideas. The patient is nervous/anxious. The patient does not have insomnia.      OBJECTIVE:  Today's Vitals   10/15/20 0808  BP: 108/63  Pulse: 69  Temp: 98 F (36.7 C)  SpO2: 99%  Weight: 138 lb (62.6 kg)  Height: _0  (1.575 m)   Body mass index is 25.24 kg/m.   Physical Exam Constitutional:      General: She is not in acute distress.    Appearance: Normal appearance. She is not ill-appearing.  HENT:  Head: Normocephalic.  Cardiovascular:     Rate and Rhythm: Normal rate and regular rhythm.     Pulses: Normal pulses.     Heart sounds: Normal heart sounds. No murmur heard. No friction rub. No gallop.   Pulmonary:     Effort: Pulmonary effort is normal. No respiratory distress.     Breath sounds: Normal breath sounds. No stridor. No wheezing, rhonchi or rales.  Abdominal:     General: Bowel sounds are normal.     Palpations: Abdomen is soft.     Tenderness: There is no abdominal tenderness.  Musculoskeletal:     Right knee: Normal.     Left knee: Normal.     Right lower leg: No edema.     Left lower leg: No edema.  Skin:    General: Skin is warm and dry.  Neurological:     Mental Status: She is alert and oriented to person, place, and time.  Psychiatric:        Mood and Affect: Mood normal.        Behavior: Behavior normal.     No results found for this or any previous visit (from the past 24 hour(s)).  No results found.   ASSESSMENT and PLAN  Problem List Items Addressed This Visit      Cardiovascular and Mediastinum   Migraine headache (Chronic)   Relevant Medications   sertraline (ZOLOFT) 50 MG tablet   rizatriptan (MAXALT) 10 MG tablet   clonazePAM (KLONOPIN) 0.5 MG tablet     Respiratory   Other allergic rhinitis   Relevant Medications   albuterol (VENTOLIN HFA) 108 (90 Base)  MCG/ACT inhaler   montelukast (SINGULAIR) 10 MG tablet   cetirizine (ZYRTEC) 10 MG tablet   fluticasone (FLONASE) 50 MCG/ACT nasal spray     Other   Vitamin D deficiency - Primary (Chronic)   Relevant Orders   VITAMIN D 25 Hydroxy (Vit-D Deficiency, Fractures)   Anxiety and depression (Chronic)   Relevant Medications   sertraline (ZOLOFT) 50 MG tablet   ondansetron (ZOFRAN) 4 MG tablet   clonazePAM (KLONOPIN) 0.5 MG tablet   Other Relevant Orders   CBC    Other Visit Diagnoses    Screening, lipid       Relevant Orders   Lipid panel   Screening for metabolic disorder       Relevant Orders   CMP14+EGFR   Hemoglobin A1c   Encounter for screening mammogram for malignant neoplasm of breast       Relevant Orders   MM DIGITAL SCREENING BILATERAL   Encounter for hepatitis C screening test for low risk patient       Relevant Orders   Hepatitis C antibody   Acute pain of left knee          Plan   Medication refills sent, chronic conditions stable on current regimens  Will follow up with lab results  Encouraged to follow up on Zoloft in 5 weeks  Encouraged to follow up with gyn for pap  Declined PT and ortho referral for knee pain continue brace and home exercises  Order placed for mammogram   Return in about 6 months (around 04/14/2021).    Huston Foley Bethsaida Siegenthaler, FNP-BC [facility] [place], [place] [address] Ph.  [phone] Fax [phone]

## 2020-10-16 LAB — CMP14+EGFR
ALT: 9 IU/L (ref 0–32)
AST: 12 IU/L (ref 0–40)
Albumin/Globulin Ratio: 1.5 (ref 1.2–2.2)
Albumin: 4.1 g/dL (ref 3.8–4.8)
Alkaline Phosphatase: 56 IU/L (ref 44–121)
BUN/Creatinine Ratio: 10 (ref 9–23)
BUN: 9 mg/dL (ref 6–24)
Bilirubin Total: <0.2 mg/dL (ref 0.0–1.2)
CO2: 23 mmol/L (ref 20–29)
Calcium: 9.3 mg/dL (ref 8.7–10.2)
Chloride: 103 mmol/L (ref 96–106)
Creatinine, Ser: 0.88 mg/dL (ref 0.57–1.00)
GFR calc Af Amer: 94 mL/min/{1.73_m2} (ref >59)
GFR calc non Af Amer: 82 mL/min/{1.73_m2} (ref >59)
Globulin, Total: 2.7 g/dL (ref 1.5–4.5)
Glucose: 93 mg/dL (ref 65–99)
Potassium: 4 mmol/L (ref 3.5–5.2)
Sodium: 140 mmol/L (ref 134–144)
Total Protein: 6.8 g/dL (ref 6.0–8.5)

## 2020-10-16 LAB — LIPID PANEL
Chol/HDL Ratio: 3.6 ratio (ref 0.0–4.4)
Cholesterol, Total: 184 mg/dL (ref 100–199)
HDL: 51 mg/dL (ref >39)
LDL Chol Calc (NIH): 116 mg/dL — ABNORMAL HIGH (ref 0–99)
Triglycerides: 92 mg/dL (ref 0–149)
VLDL Cholesterol Cal: 17 mg/dL (ref 5–40)

## 2020-10-16 LAB — CBC
Hematocrit: 37.1 % (ref 34.0–46.6)
Hemoglobin: 12.2 g/dL (ref 11.1–15.9)
MCH: 30.5 pg (ref 26.6–33.0)
MCHC: 32.9 g/dL (ref 31.5–35.7)
MCV: 93 fL (ref 79–97)
Platelets: 279 10*3/uL (ref 150–450)
RBC: 4 x10E6/uL (ref 3.77–5.28)
RDW: 11.5 % — ABNORMAL LOW (ref 11.7–15.4)
WBC: 5.6 10*3/uL (ref 3.4–10.8)

## 2020-10-16 LAB — HEMOGLOBIN A1C
Est. average glucose Bld gHb Est-mCnc: 111 mg/dL
Hgb A1c MFr Bld: 5.5 % (ref 4.8–5.6)

## 2020-10-16 LAB — HEPATITIS C ANTIBODY: Hep C Virus Ab: <0.1 s/co ratio (ref 0.0–0.9)

## 2020-10-16 LAB — VITAMIN D 25 HYDROXY (VIT D DEFICIENCY, FRACTURES): Vit D, 25-Hydroxy: 22.7 ng/mL — ABNORMAL LOW (ref 30.0–100.0)

## 2020-11-24 ENCOUNTER — Encounter: Payer: Self-pay | Admitting: Family Medicine

## 2020-12-04 ENCOUNTER — Other Ambulatory Visit: Payer: Self-pay

## 2020-12-04 ENCOUNTER — Ambulatory Visit: Payer: 59 | Admitting: Family Medicine

## 2020-12-04 ENCOUNTER — Encounter: Payer: Self-pay | Admitting: Family Medicine

## 2020-12-04 DIAGNOSIS — F419 Anxiety disorder, unspecified: Secondary | ICD-10-CM | POA: Diagnosis not present

## 2020-12-04 DIAGNOSIS — F32A Depression, unspecified: Secondary | ICD-10-CM

## 2020-12-04 DIAGNOSIS — G43019 Migraine without aura, intractable, without status migrainosus: Secondary | ICD-10-CM | POA: Diagnosis not present

## 2020-12-04 DIAGNOSIS — J3089 Other allergic rhinitis: Secondary | ICD-10-CM

## 2020-12-04 MED ORDER — RIZATRIPTAN BENZOATE 10 MG PO TABS: 10.0000 mg | ORAL_TABLET | ORAL | 5 refills | Status: DC | PRN

## 2020-12-04 MED ORDER — SERTRALINE HCL 100 MG PO TABS
100.0000 mg | ORAL_TABLET | Freq: Every day | ORAL | 3 refills | Status: DC
Start: 2020-12-04 — End: 2023-11-17

## 2020-12-04 MED ORDER — CETIRIZINE HCL 10 MG PO TABS: 10.0000 mg | ORAL_TABLET | Freq: Every day | ORAL | 3 refills | Status: DC

## 2020-12-04 MED ORDER — MONTELUKAST SODIUM 10 MG PO TABS: 10.0000 mg | ORAL_TABLET | Freq: Every day | ORAL | 1 refills | Status: DC

## 2020-12-04 MED ORDER — FLUTICASONE PROPIONATE 50 MCG/ACT NA SUSP
2.0000 | Freq: Every day | NASAL | 3 refills | Status: DC
Start: 2020-12-04 — End: 2021-12-21

## 2020-12-04 NOTE — Patient Instructions (Addendum)
  Health Maintenance, Female Adopting a healthy lifestyle and getting preventive care are important in promoting health and wellness. Ask your health care provider about:  The right schedule for you to have regular tests and exams.  Things you can do on your own to prevent diseases and keep yourself healthy. What should I know about diet, weight, and exercise? Eat a healthy diet  Eat a diet that includes plenty of vegetables, fruits, low-fat dairy products, and lean protein.  Do not eat a lot of foods that are high in solid fats, added sugars, or sodium.   Maintain a healthy weight Body mass index (BMI) is used to identify weight problems. It estimates body fat based on height and weight. Your health care provider can help determine your BMI and help you achieve or maintain a healthy weight. Get regular exercise Get regular exercise. This is one of the most important things you can do for your health. Most adults should:  Exercise for at least 150 minutes each week. The exercise should increase your heart rate and make you sweat (moderate-intensity exercise).  Do strengthening exercises at least twice a week. This is in addition to the moderate-intensity exercise.  Spend less time sitting. Even light physical activity can be beneficial. Watch cholesterol and blood lipids Have your blood tested for lipids and cholesterol at 42 years of age, then have this test every 5 years. Have your cholesterol levels checked more often if:  Your lipid or cholesterol levels are high.  You are older than 42 years of age.  You are at high risk for heart disease. What should I know about cancer screening? Depending on your health history and family history, you may need to have cancer screening at various ages. This may include screening for:  Breast cancer.  Cervical cancer.  Colorectal cancer.  Skin cancer.  Lung cancer. What should I know about heart disease, diabetes, and high blood  pressure? Blood pressure and heart disease  High blood pressure causes heart disease and increases the risk of stroke. This is more likely to develop in people who have high blood pressure readings, are of African descent, or are overweight.  Have your blood pressure checked: ? Every 3-5 years if you are 18-39 years of age. ? Every year if you are 40 years old or older. Diabetes Have regular diabetes screenings. This checks your fasting blood sugar level. Have the screening done:  Once every three years after age 40 if you are at a normal weight and have a low risk for diabetes.  More often and at a younger age if you are overweight or have a high risk for diabetes. What should I know about preventing infection? Hepatitis B If you have a higher risk for hepatitis B, you should be screened for this virus. Talk with your health care provider to find out if you are at risk for hepatitis B infection. Hepatitis C Testing is recommended for:  Everyone born from 1945 through 1965.  Anyone with known risk factors for hepatitis C. Sexually transmitted infections (STIs)  Get screened for STIs, including gonorrhea and chlamydia, if: ? You are sexually active and are younger than 42 years of age. ? You are older than 42 years of age and your health care provider tells you that you are at risk for this type of infection. ? Your sexual activity has changed since you were last screened, and you are at increased risk for chlamydia or gonorrhea. Ask your health   care provider if you are at risk.  Ask your health care provider about whether you are at high risk for HIV. Your health care provider may recommend a prescription medicine to help prevent HIV infection. If you choose to take medicine to prevent HIV, you should first get tested for HIV. You should then be tested every 3 months for as long as you are taking the medicine. Pregnancy  If you are about to stop having your period (premenopausal) and  you may become pregnant, seek counseling before you get pregnant.  Take 400 to 800 micrograms (mcg) of folic acid every day if you become pregnant.  Ask for birth control (contraception) if you want to prevent pregnancy. Osteoporosis and menopause Osteoporosis is a disease in which the bones lose minerals and strength with aging. This can result in bone fractures. If you are 65 years old or older, or if you are at risk for osteoporosis and fractures, ask your health care provider if you should:  Be screened for bone loss.  Take a calcium or vitamin D supplement to lower your risk of fractures.  Be given hormone replacement therapy (HRT) to treat symptoms of menopause. Follow these instructions at home: Lifestyle  Do not use any products that contain nicotine or tobacco, such as cigarettes, e-cigarettes, and chewing tobacco. If you need help quitting, ask your health care provider.  Do not use street drugs.  Do not share needles.  Ask your health care provider for help if you need support or information about quitting drugs. Alcohol use  Do not drink alcohol if: ? Your health care provider tells you not to drink. ? You are pregnant, may be pregnant, or are planning to become pregnant.  If you drink alcohol: ? Limit how much you use to 0-1 drink a day. ? Limit intake if you are breastfeeding.  Be aware of how much alcohol is in your drink. In the [place] one drink equals one 12 oz bottle of beer (355 mL), one 5 oz glass of wine (148 mL), or one 1 oz glass of hard liquor (44 mL). General instructions  Schedule regular health, dental, and eye exams.  Stay current with your vaccines.  Tell your health care provider if: ? You often feel depressed. ? You have ever been abused or do not feel safe at home. Summary  Adopting a healthy lifestyle and getting preventive care are important in promoting health and wellness.  Follow your health care provider's instructions about healthy  diet, exercising, and getting tested or screened for diseases.  Follow your health care provider's instructions on monitoring your cholesterol and blood pressure. This information is not intended to replace advice given to you by your health care provider. Make sure you discuss any questions you have with your health care provider. Document Revised: 08/30/2018 Document Reviewed: 08/30/2018 Elsevier Patient Education  2021 Elsevier Inc.   If you have lab work done today you will be contacted with your lab results within the next 2 weeks.  If you have not heard from us then please contact us. The fastest way to get your results is to register for My Chart.   IF you received an x-ray today, you will receive an invoice from [facility]. Please contact [facility] at [phone] with questions or concerns regarding your invoice.   IF you received labwork today, you will receive an invoice from [facility]. Please contact [facility] at [phone] with questions or concerns regarding your invoice.   Our billing   staff will not be able to assist you with questions regarding bills from these companies.  You will be contacted with the lab results as soon as they are available. The fastest way to get your results is to activate your My Chart account. Instructions are located on the last page of this paperwork. If you have not heard from us regarding the results in 2 weeks, please contact this office.      

## 2020-12-04 NOTE — Progress Notes (Signed)
3/17/20222:24 PM  [place] Feb 24, 1981, 40 y.o., female [id number]  Chief Complaint  Patient presents with  . 2 month folow up     Patient voices no concerns with medications  . Anxiety    And depression    HPI:   Patient is a 40 y.o. female with past medical history significant for allergies, depression, anxiety,migraine, vitamin d deficiency who presents today for chronic care followup.   Depression/Anxiety Started Zoloft 50mg  last OV Always had issues with anxiety Nausea has now improved Taking at night and with zofran helps Still having issues Focuses on work Lifestyle coping mechanisms Had seen a counselor in the past PHQ9= 10 Gad=17   Acne OTC creams  Allergies Daily singulair Flonase and cetrizine as needed Albuterol inhaler rare  Migraines Controlled well with maxalt Had a bad episode last week that came on suddenly  Other than that has been doing really well  Knee pain left Went to PT previously runners knee Ice for pain Never takes medication for pain Denies instability Has not been better since last OV Does not want to go to ortho or PT  Started OTC vitamin D supplement since last OV Last vitamin D Lab Results  Component Value Date   VD25OH 22.7 (L) 10/15/2020     [facility]: pap Can be heavy periods, Regular No contraception: husband had vasectomy Mammogram: will have in 2 weeks  Health Maintenance  Topic Date Due  . MAMMOGRAM  11/28/2019  . INFLUENZA VACCINE  04/20/2020  . PAP SMEAR-Modifier  12/08/2020  . TETANUS/TDAP  05/18/2021  . COVID-19 Vaccine  Completed  . Hepatitis C Screening  Completed  . HIV Screening  Completed  . HPV VACCINES  Aged Out     Depression screen [facility] 2/9 12/04/2020 10/15/2020 08/31/2019  Decreased Interest 2 2 2   Down, Depressed, Hopeless 1 2 2   PHQ - 2 Score 3 4 4   Altered sleeping 1 1 1   Tired, decreased energy 2 1 3   Change in appetite 2 1 3   Feeling bad or failure about yourself   0 0 1  Trouble concentrating 1 0 2  Moving slowly or fidgety/restless 1 0 2  Suicidal thoughts 0 0 0  PHQ-9 Score 10 7 16   Difficult doing work/chores Somewhat difficult Somewhat difficult Somewhat difficult    Fall Risk  12/04/2020 10/15/2020 08/31/2019 07/13/2018 05/31/2018  Falls in the past year? 0 0 0 No No  Number falls in past yr: 0 0 0 - -  Injury with Fall? 0 0 0 - -  Follow up Falls evaluation completed Falls evaluation completed Falls evaluation completed - -     Allergies  Allergen Reactions  . Topamax Nausea And Vomiting    Prior to Admission medications   Medication Sig Start Date End Date Taking? Authorizing Provider  albuterol (VENTOLIN HFA) 108 (90 Base) MCG/ACT inhaler Inhale 2 puffs into the lungs every 6 (six) hours as needed for wheezing or shortness of breath. Patient not taking: Reported on 04/14/2020 08/01/19   Wendall Mola, NP  cetirizine (ZYRTEC) 10 MG tablet Take 10 mg by mouth daily. Patient not taking: Reported on 04/14/2020    [provider]  clonazePAM (KLONOPIN) 0.5 MG tablet Take 1 tablet (0.5 mg total) by mouth 2 (two) times daily as needed for anxiety. 08/31/19 04/14/20  Wendall Mola, NP  fluticasone Asencion Islam) 50 MCG/ACT nasal spray Place 2 sprays into both nostrils daily. Patient not taking: Reported on 04/14/2020  [provider]  ketoconazole (NIZORAL) 2 % cream SMARTSIG:Sparingly Topical 1 to 2 Times Daily 02/28/20   [provider]  montelukast (SINGULAIR) 10 MG tablet Take 1 tablet (10 mg total) by mouth at bedtime. 05/12/20   Garnet Sierras, DO  rizatriptan (MAXALT) 10 MG tablet Take 1 tablet (10 mg total) by mouth as needed for migraine. May repeat in 2 hours if needed 04/24/18 04/14/20  McVey, Gelene Mink, PA-C  spironolactone (ALDACTONE) 50 MG tablet Take by mouth. 03/27/20   [provider]  tretinoin (RETIN-A) 0.05 % cream SMARTSIG:Sparingly Topical Every Night 03/01/20   [provider]   Vitamin D, Ergocalciferol, (DRISDOL) 1.25 MG (50000 UT) CAPS capsule Take 1 capsule (50,000 Units total) by mouth every 7 (seven) days. 08/31/19   Wendall Mola, NP    Past Medical History:  Diagnosis Date  . Allergy   . Anxiety   . Anxiety and depression 08/15/2019  . Asthma   . Carpal tunnel syndrome during pregnancy   . Depression    Phreesia 10/12/2020  . History of Helicobacter pylori infection 08/15/2019  . Hx of varicella   . Migraines   . Postpartum care following vaginal delivery (11/4) 07/26/2015  . Vaginal Pap smear, abnormal   . Vitamin D deficiency 08/15/2019    Past Surgical History:  Procedure Laterality Date  . [place] STUDY N/A 07/02/2019   Procedure: [place] STUDY;  Surgeon: Irene Shipper, MD;  Location: [facility];  Service: Endoscopy;  Laterality: N/A;  . DILATION AND EVACUATION N/A 08/20/2014   Procedure: DILATATION AND EVACUATION;  Surgeon: Princess Bruins, MD;  Location: [facility];  Service: Gynecology;  Laterality: N/A;  . ESOPHAGEAL MANOMETRY N/A 07/02/2019   Procedure: ESOPHAGEAL MANOMETRY (EM);  Surgeon: Irene Shipper, MD;  Location: [facility];  Service: Endoscopy;  Laterality: N/A;  . [place] IMPEDANCE STUDY N/A 07/02/2019   Procedure: [place] IMPEDANCE STUDY;  Surgeon: Irene Shipper, MD;  Location: [facility];  Service: Endoscopy;  Laterality: N/A;  . WISDOM TOOTH EXTRACTION      Social History   Tobacco Use  . Smoking status: Never Smoker  . Smokeless tobacco: Never Used  Substance Use Topics  . Alcohol use: Yes    Comment: socially-wine but none with pregnancy    Family History  Problem Relation Age of Onset  . Cancer Mother 51       breast  . Cancer Father 88       prostate  . Stroke Maternal Grandfather   . Cancer Maternal Grandfather        colon- 71s  . Cancer Paternal Grandfather   . Heart attack Paternal Grandmother   . Cancer Maternal Aunt        Breast and lung  . Cancer Maternal Uncle        Lung  . Down  syndrome Paternal Aunt     Review of Systems  Respiratory: Negative for cough and shortness of breath.   Cardiovascular: Negative for chest pain and palpitations.  Gastrointestinal: Negative for abdominal pain, constipation, diarrhea, heartburn, nausea and vomiting.  Genitourinary: Negative for dysuria, flank pain, frequency, hematuria and urgency.  Musculoskeletal: Positive for joint pain (knee pain). Negative for myalgias.  Neurological: Positive for headaches.  Psychiatric/Behavioral: Positive for depression. Negative for suicidal ideas. The patient is nervous/anxious. The patient does not have insomnia.      OBJECTIVE:  Today's Vitals   12/04/20 1348  BP: 124/83  Pulse: 65  Temp: 98.4 F (36.9 C)  SpO2: 99%  Weight: 135 lb (61.2 kg)  Height: 5\' 2"  (1.575 m)   Body mass index is 24.69 kg/m.   Physical Exam Constitutional:      General: She is not in acute distress.    Appearance: Normal appearance. She is not ill-appearing.  HENT:     Head: Normocephalic.  Cardiovascular:     Rate and Rhythm: Normal rate and regular rhythm.     Pulses: Normal pulses.     Heart sounds: Normal heart sounds. No murmur heard. No friction rub. No gallop.   Pulmonary:     Effort: Pulmonary effort is normal. No respiratory distress.     Breath sounds: Normal breath sounds. No stridor. No wheezing, rhonchi or rales.  Abdominal:     General: Bowel sounds are normal.     Palpations: Abdomen is soft.     Tenderness: There is no abdominal tenderness.  Musculoskeletal:     Right knee: Normal.     Left knee: Normal.     Right lower leg: No edema.     Left lower leg: No edema.  Skin:    General: Skin is warm and dry.  Neurological:     Mental Status: She is alert and oriented to person, place, and time.  Psychiatric:        Mood and Affect: Mood normal.        Behavior: Behavior normal.     No results found for this or any previous visit (from the past 24 hour(s)).  No results  found.   ASSESSMENT and PLAN  Problem List Items Addressed This Visit      Cardiovascular and Mediastinum   Migraine headache (Chronic)   Relevant Medications   sertraline (ZOLOFT) 100 MG tablet   rizatriptan (MAXALT) 10 MG tablet     Respiratory   Other allergic rhinitis   Relevant Medications   cetirizine (ZYRTEC) 10 MG tablet   fluticasone (FLONASE) 50 MCG/ACT nasal spray   montelukast (SINGULAIR) 10 MG tablet     Other   Anxiety and depression (Chronic)   Relevant Medications   sertraline (ZOLOFT) 100 MG tablet      Plan  Increased zoloft 50 to 100  Medication refills sent, r/se/b discussed  RTC/ED precautions provided  Safety numbers provided  Declined Pt and ortho eval at this time   Return in about 6 months (around 06/06/2021).    Huston Foley Yuleni Burich, FNP-BC [facility] at [facility] [place], [place] [address] Ph.  [phone] Fax [phone]

## 2021-04-14 ENCOUNTER — Ambulatory Visit: Payer: 59 | Admitting: Family Medicine

## 2021-05-08 IMAGING — DX DG CHEST 2V
2 series · 2 of 2 positions shown · non-contrast
Comparison: 10/23/2009

CLINICAL DATA: Chronic cough

EXAM:
CHEST - 2 VIEW

[chest pa]
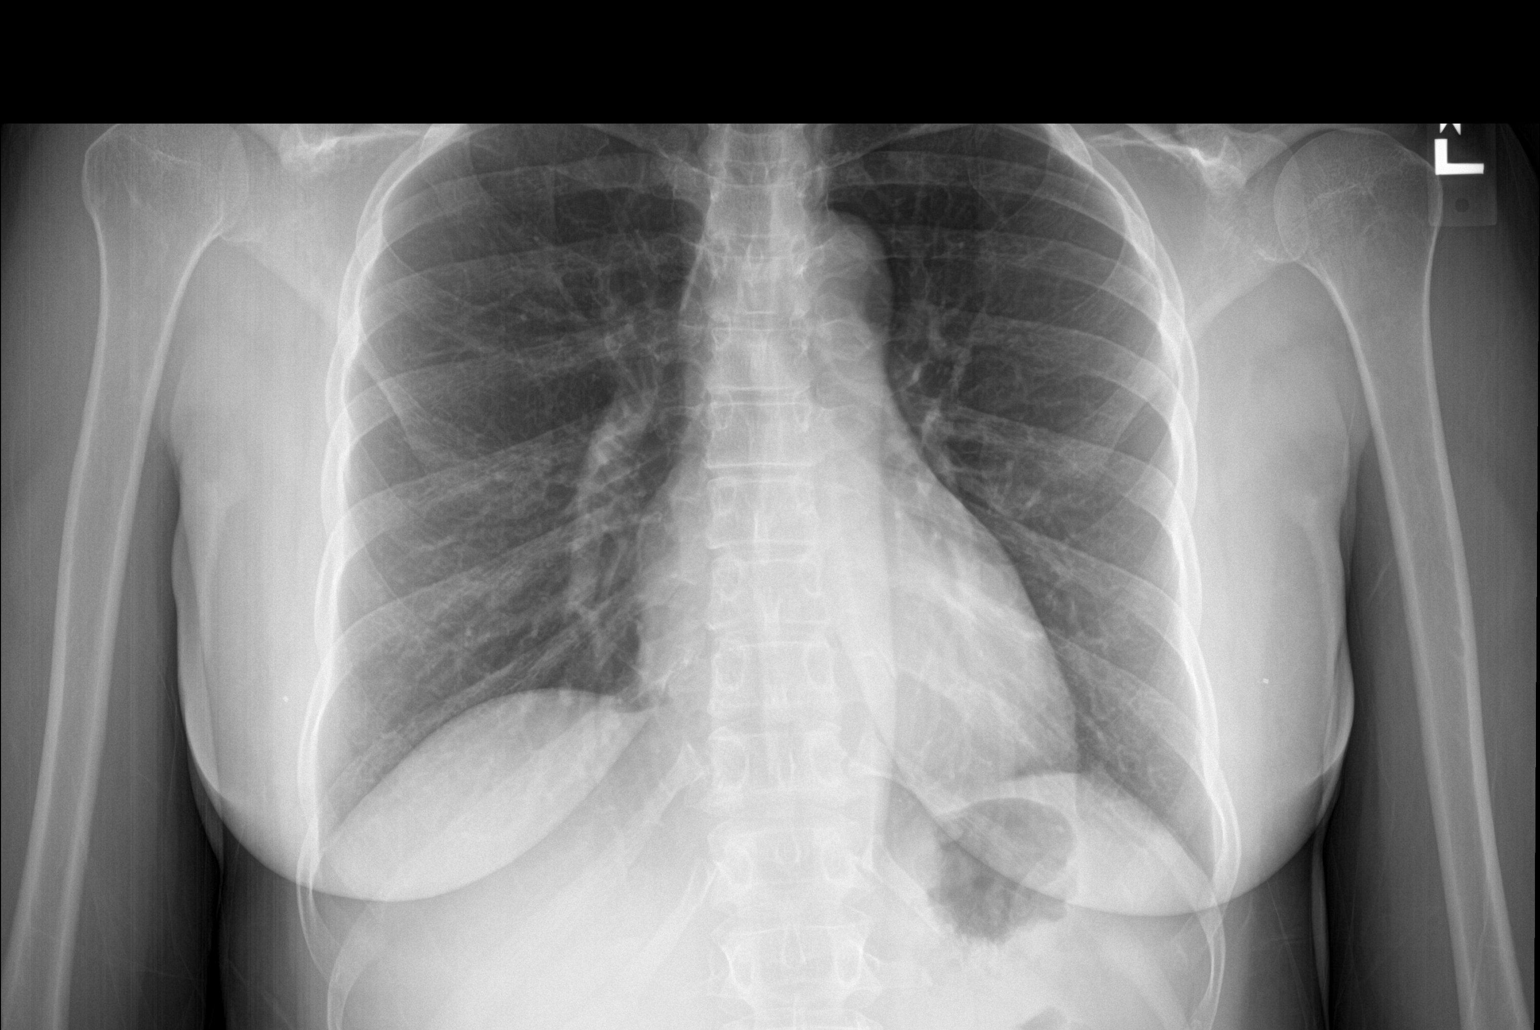

[chest lat]
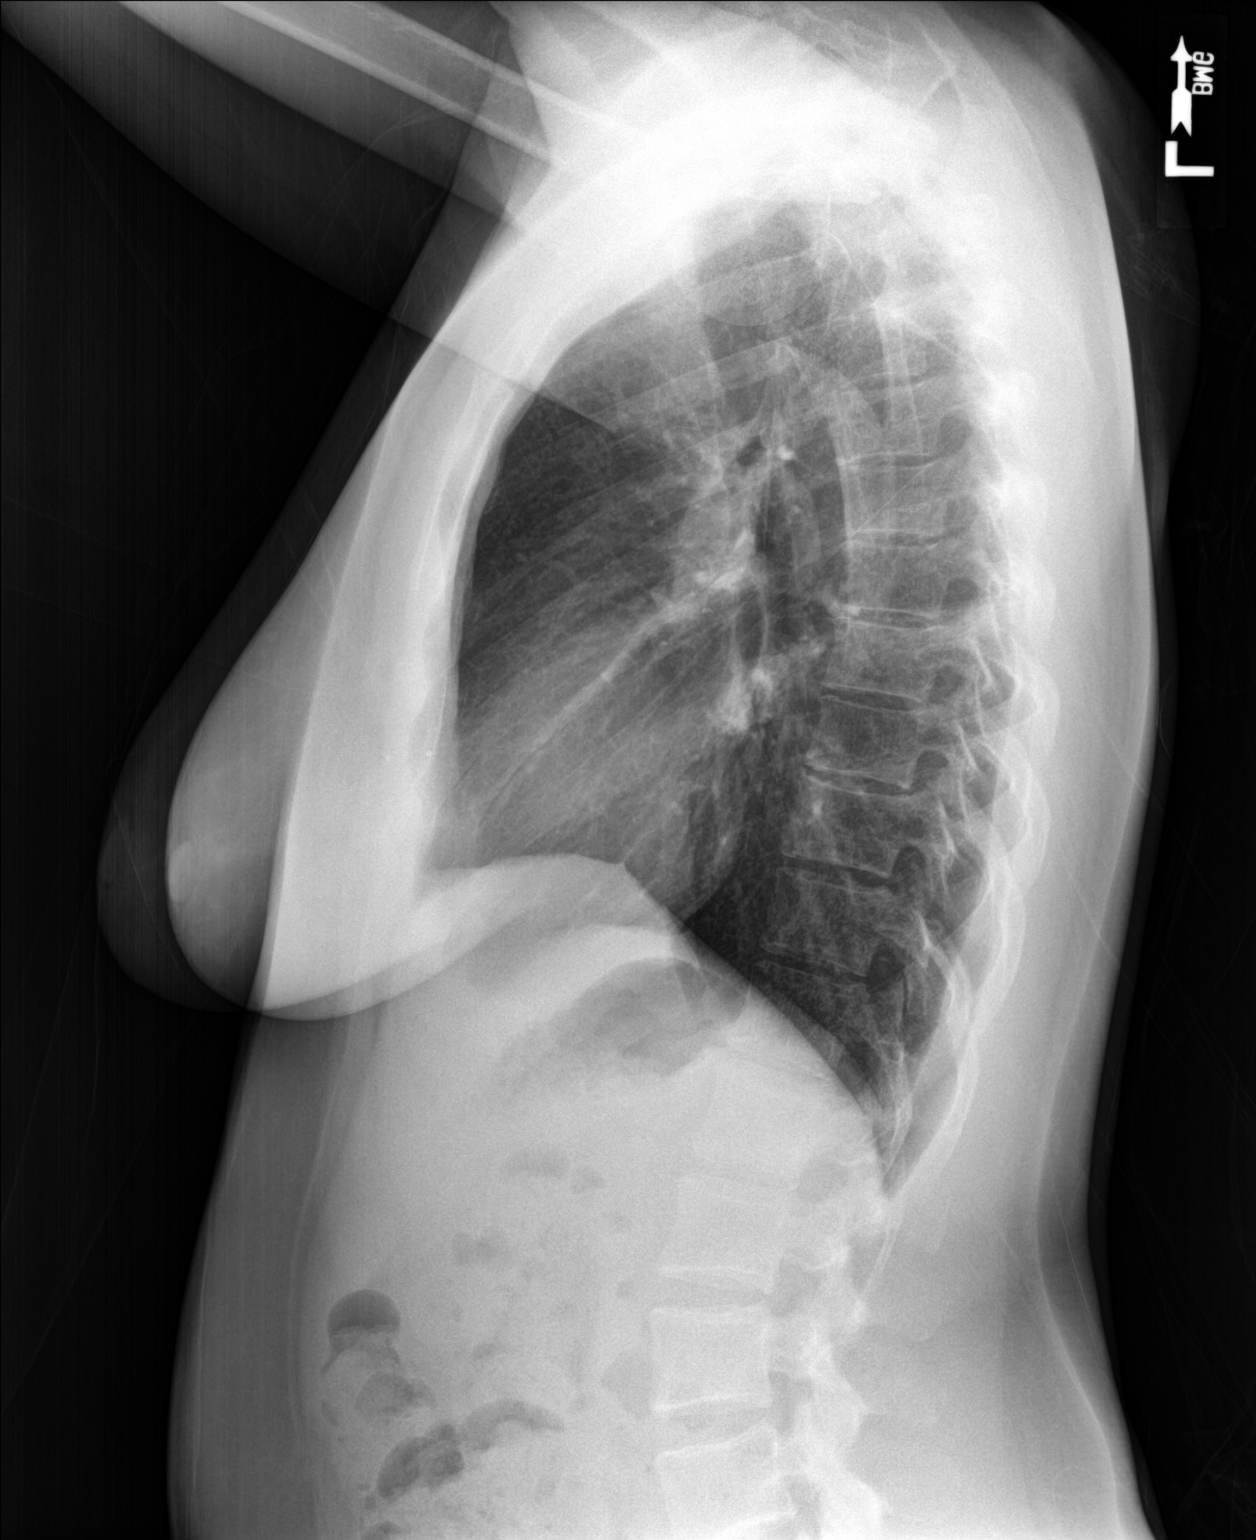

[2 of 2 positions shown; findings below may reference images not displayed]

FINDINGS: The heart size and mediastinal contours are within normal limits.
Both lungs are clear. The visualized skeletal structures are
unremarkable.
IMPRESSION: No acute abnormality of the lungs.

## 2021-05-27 IMAGING — CT CT CHEST W/O CM
1 series · 16 of 34 positions shown, 20 images · non-contrast
Comparison: None.

CLINICAL DATA: Pain

EXAM:
CT CHEST WITHOUT CONTRAST
TECHNIQUE: Multidetector CT imaging of the chest was performed following the
standard protocol without IV contrast.

[Series 2: chest w/(date) · axial · 0.58mm/px · z∈[-277,-13]mm · 16 of 150 slices shown, 20 images]
[im 12/150  mediastinal]
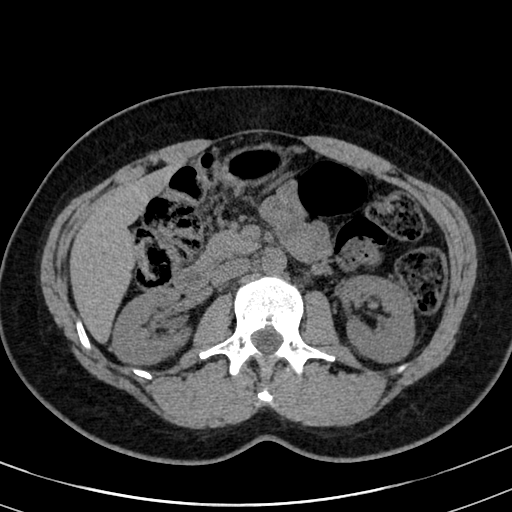
[im 12/150  lung]
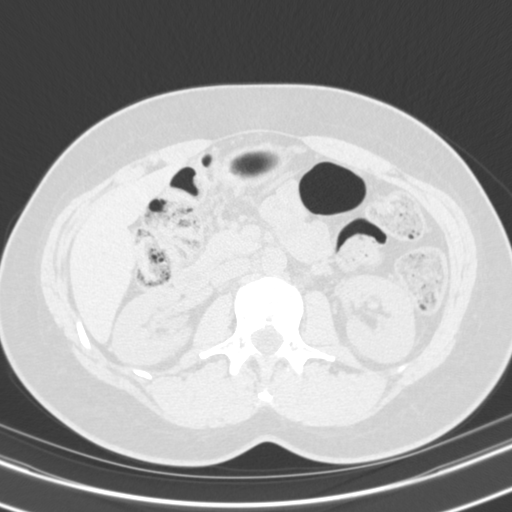
[im 23/150  lung]
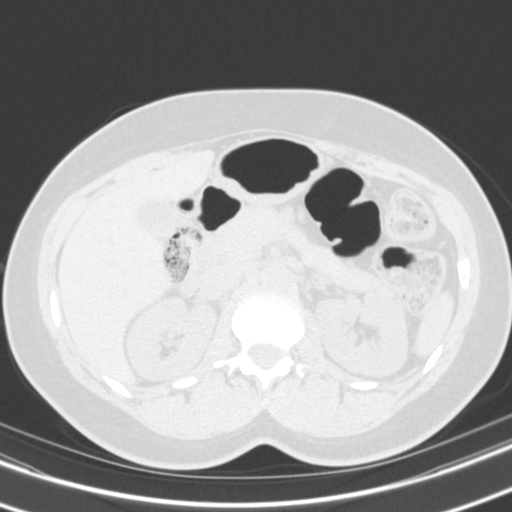
[im 30/150  lung]
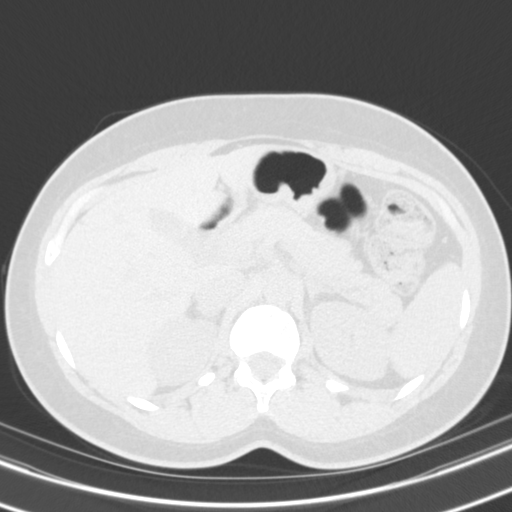
[im 39/150  lung]
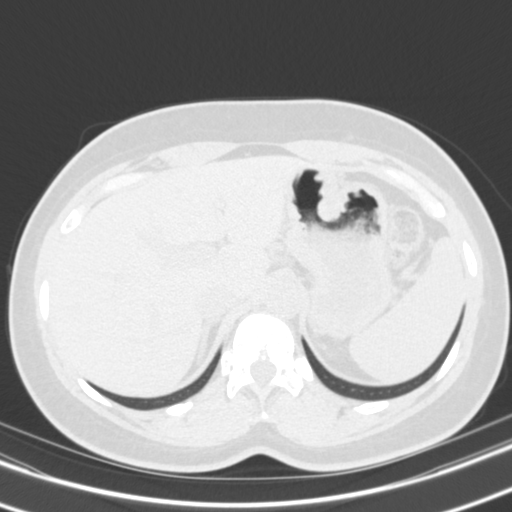
[im 50/150  mediastinal]
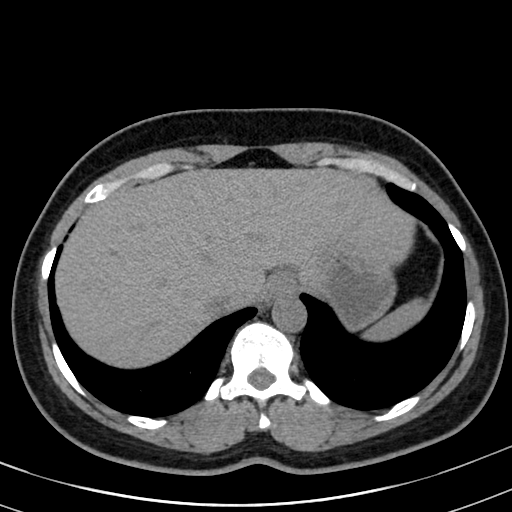
[im 50/150  lung]
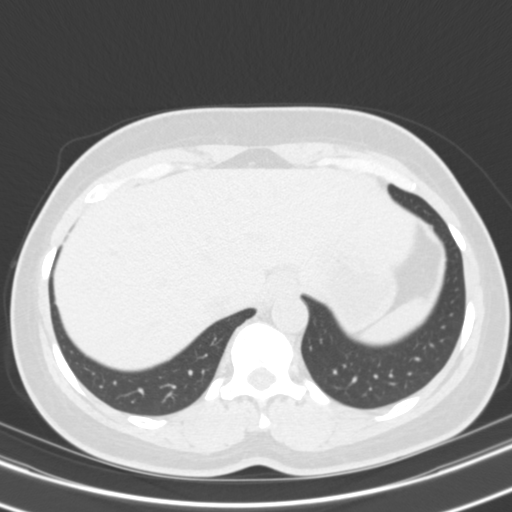
[im 60/150  lung]
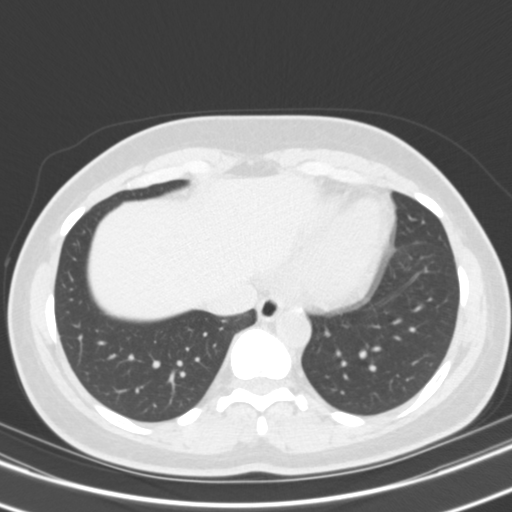
[im 67/150  lung]
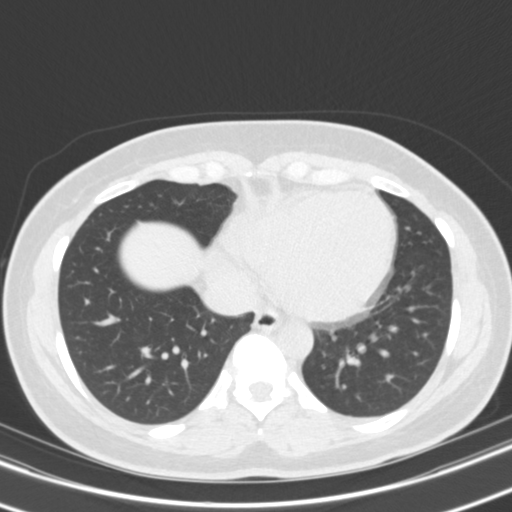
[im 72/150  lung]
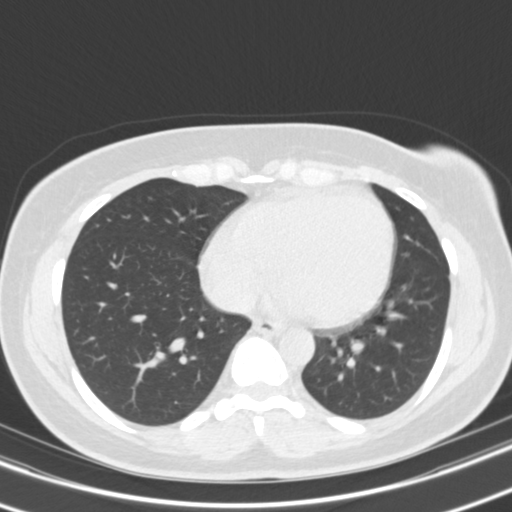
[im 80/150  mediastinal]
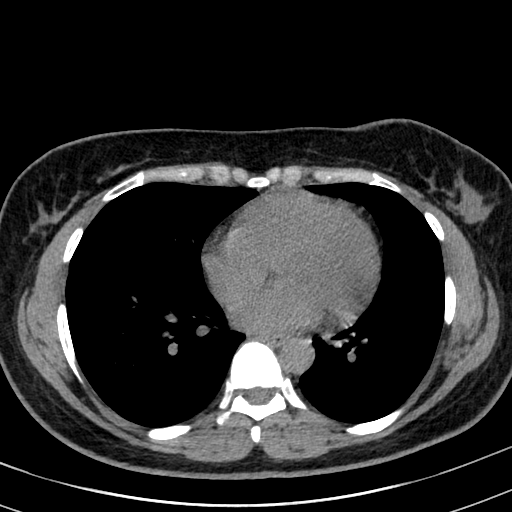
[im 80/150  lung]
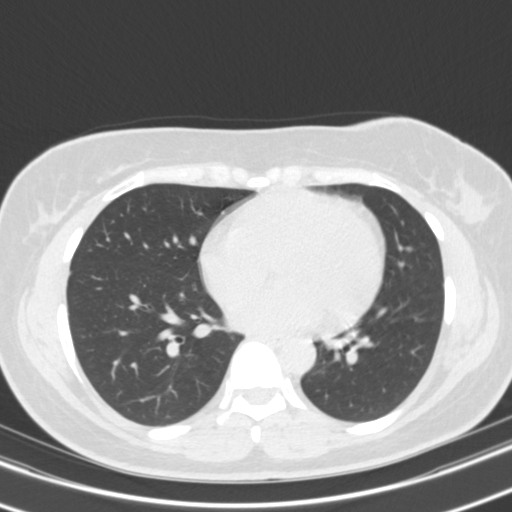
[im 89/150  lung]
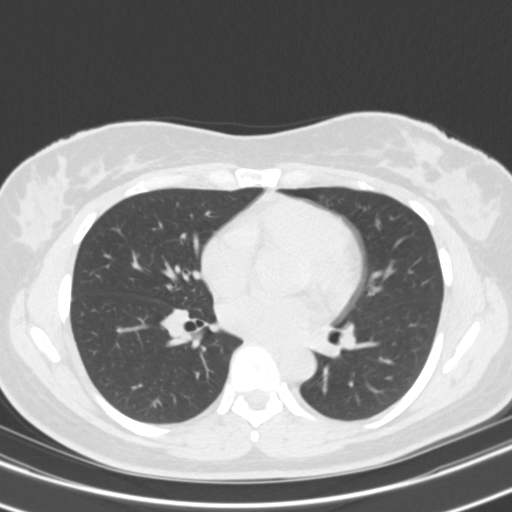
[im 94/150  lung]
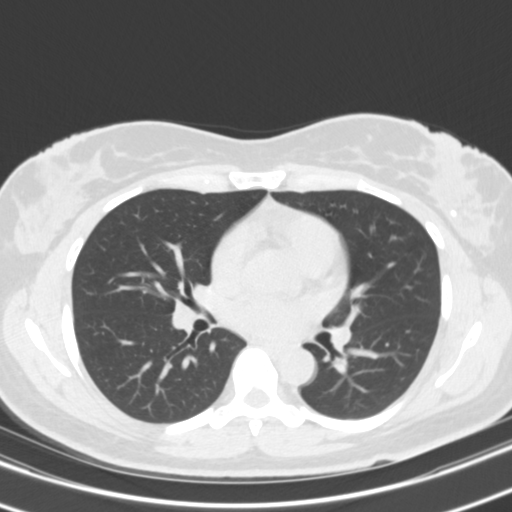
[im 105/150  lung]
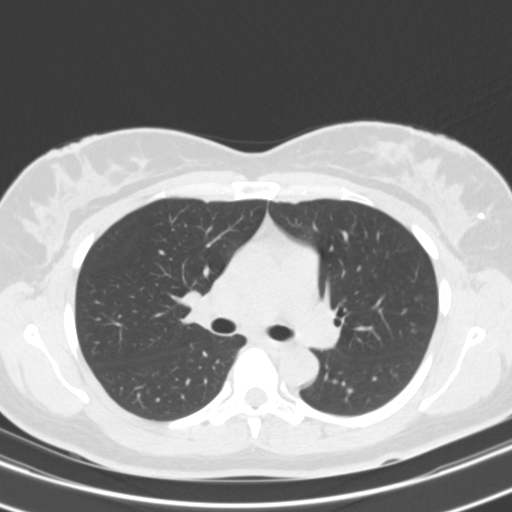
[im 116/150  mediastinal]
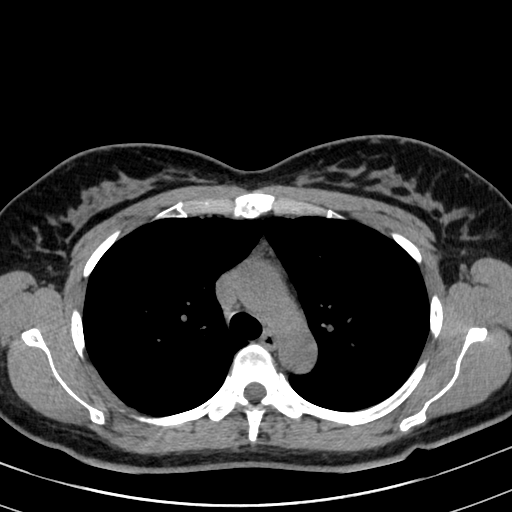
[im 116/150  lung]
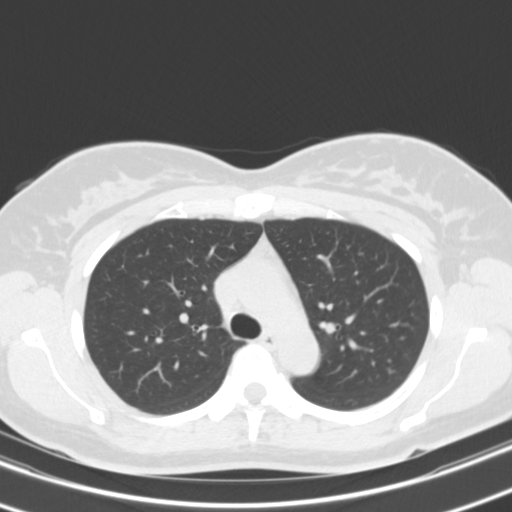
[im 122/150  lung]
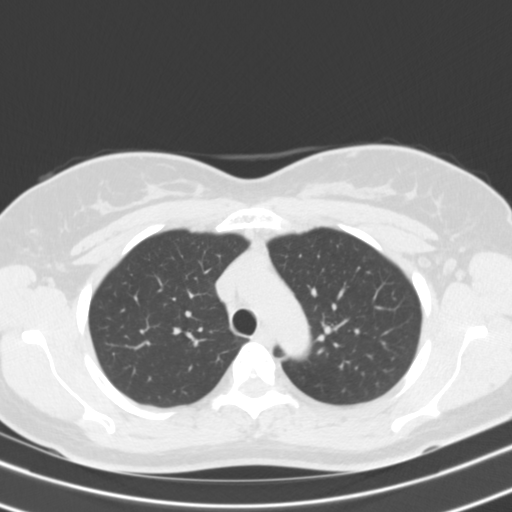
[im 133/150  lung]
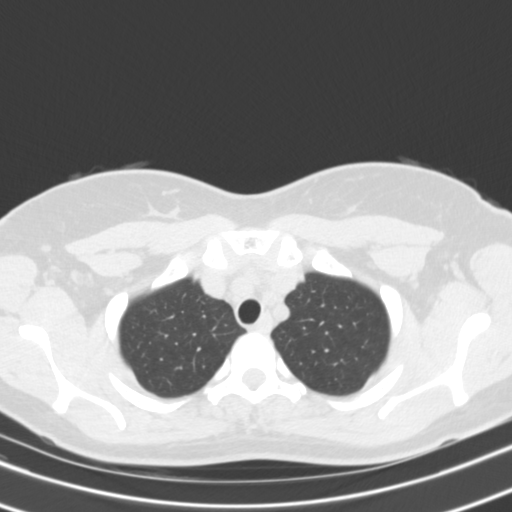
[im 144/150  lung]
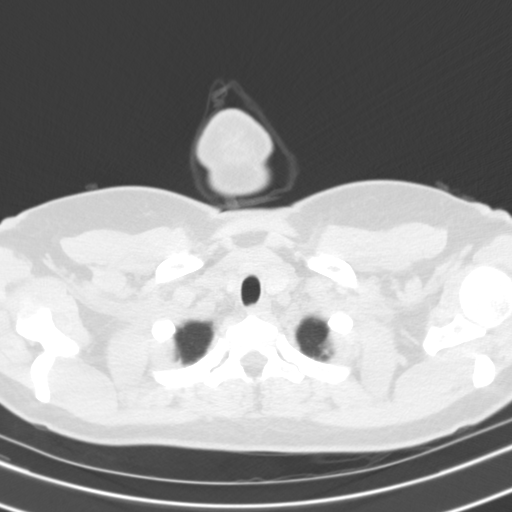

[16 of 34 positions shown; findings below may reference images not displayed]

FINDINGS: Cardiovascular: No significant vascular findings. Normal heart size.
No pericardial effusion.

Mediastinum/Nodes: No enlarged mediastinal or axillary lymph nodes.
Thyroid gland, trachea, and esophagus demonstrate no significant
findings.

Lungs/Pleura: Lungs showed no consolidation or pleural effusion.
Minimal left upper lobe posterior-apical subpleural
scarring-nodularity. No pneumothorax.

Upper Abdomen: No acute abnormality.  Bilateral nephrolithiasis.

Musculoskeletal: No chest wall mass or suspicious bone lesions
identified.
IMPRESSION: No acute findings.Bilateral nephrolithiasis.

## 2021-08-20 NOTE — Progress Notes (Signed)
[facility] [place] [address] Dept: [phone]  FOLLOW UP NOTE  Patient ID: Kathleen Conway, female    DOB: Oct 10, 1980  Age: 40 y.o. MRN: [id number] Date of Office Visit: 08/21/2021  Assessment  Chief Complaint: Allergic Rhinitis  and Cough  HPI Kathleen Conway is a 40 year old female who presents the clinic for follow-up visit.  She was last seen in this clinic on 04/14/2020 by Dr. Maudie Mercury for evaluation of allergic rhinitis and cough. At today's visit, she reports her allergic rhinitis has been moderately well controlled with the main symptom of postnasal drainage.  She denies rhinorrhea, congestion, or sneezing.  She continues Flonase daily and is not currently using a nasal saline rinse.  She demonstrates poor Flonase application technique.  She reports that she has been out of cetirizine and montelukast for the last 2 months.  Her last allergy skin testing was on 12/10/2019 and was positive to dust mite and cockroach. Cough is reported as not well controlled with symptoms beginning about 4 to 5 months ago.  She reports the cough occurs equally during the day and at nighttime and is worse while eating and after eating.  She reports posttussive vomiting about once a month.  She reports occasional shortness of breath with activity or rest.  She continues to use her albuterol inhaler with moderate relief of symptoms.  She does report and frequent heartburn occurring about once every 6 months.  She is not currently taking a medication to control reflux.  She had an initial work-up with [facility] with treatment for H. pylori.  She had follow-up studies including esophageal manometry indicating normal esophageal motility and no acid reflux on 07/11/2019.  Her current medications are listed in the chart.   Drug Allergies:  Allergies  Allergen Reactions   Topamax Nausea And Vomiting    Physical Exam: BP 124/80   Pulse 88   Temp 98.8 F (37.1 C) (Temporal)    Resp 17   Ht 5\' 1"  (1.549 m)   Wt 146 lb 3.2 oz (66.3 kg)   SpO2 98%   BMI 27.62 kg/m    Physical Exam Vitals reviewed.  Constitutional:      Appearance: Normal appearance.  HENT:     Head: Normocephalic and atraumatic.     Right Ear: Tympanic membrane normal.     Left Ear: Tympanic membrane normal.     Nose:     Comments: Bilateral nares slightly erythematous with clear nasal drainage noted.  Pharynx normal.  Ears normal.  Eyes normal.    Mouth/Throat:     Pharynx: Oropharynx is clear.  Eyes:     Conjunctiva/sclera: Conjunctivae normal.  Cardiovascular:     Rate and Rhythm: Normal rate and regular rhythm.     Heart sounds: Normal heart sounds. No murmur heard. Pulmonary:     Effort: Pulmonary effort is normal.     Breath sounds: Normal breath sounds.     Comments: Lungs clear to auscultation Musculoskeletal:        General: Normal range of motion.     Cervical back: Normal range of motion and neck supple.  Skin:    General: Skin is warm and dry.  Neurological:     Mental Status: She is alert and oriented to person, place, and time.  Psychiatric:        Mood and Affect: Mood normal.        Behavior: Behavior normal.        Thought Content:  Thought content normal.        Judgment: Judgment normal.    Diagnostics: FVC 1.97, FEV1 1.59.  Predicted FVC 2.72, predicted FEV1 2.24.  Spirometry indicates mild restriction.  Assessment and Plan: 1. Chronic cough   2. Perennial allergic rhinitis   3. Gastroesophageal reflux disease, unspecified whether esophagitis present     Meds ordered this encounter  Medications   cetirizine (ZYRTEC) 10 MG tablet    Sig: Take 1 tablet (10 mg total) by mouth daily.    Dispense:  90 tablet    Refill:  3   montelukast (SINGULAIR) 10 MG tablet    Sig: Take 1 tablet (10 mg total) by mouth at bedtime.    Dispense:  90 tablet    Refill:  1     Patient Instructions  Cough Restart montelukast 10 mg once a day to prevent cough or  wheeze Continue albuterol 2 puffs once every 4 hours as needed for cough or wheeze You may use albuterol 2 puffs 5 to 15 minutes before activity to decrease cough or wheeze  Allergic rhinitis Continue allergen avoidance measures directed toward dust mite and cockroach as listed below Continue montelukast 10 mg once a day Continue an antihistamine once a day as needed for runny nose or itch.Remember to rotate to a different antihistamine about every 3 months. Some examples of over the counter antihistamines include Zyrtec (cetirizine), Xyzal (levocetirizine), Allegra (fexofenadine), and Claritin (loratidine).  A sample of Ryaltris has been provided. Use 2 sprays in each nostril twice a day. This is a combination of a nasal steroid and an antihistamine.  Begin saline nasal rinses as needed for nasal symptoms. Use this before any medicated nasal sprays for best result  Reflux Continue dietary and lifestyle modifications as listed below Begin famotidine 20 mg once a day if needed for reflux control  Call the clinic if this treatment plan is not working well for you.  Follow up in 4 months or sooner if needed.   Return in about 4 months (around 12/20/2021), or if symptoms worsen or fail to improve.    Thank you for the opportunity to care for this patient.  Please do not hesitate to contact me with questions.  Gareth Morgan, FNP [facility]

## 2021-08-20 NOTE — Patient Instructions (Addendum)
Cough Restart montelukast 10 mg once a day to prevent cough or wheeze Continue albuterol 2 puffs once every 4 hours as needed for cough or wheeze You may use albuterol 2 puffs 5 to 15 minutes before activity to decrease cough or wheeze  Allergic rhinitis Continue allergen avoidance measures directed toward dust mite and cockroach as listed below Continue montelukast 10 mg once a day Continue an antihistamine once a day as needed for runny nose or itch.Remember to rotate to a different antihistamine about every 3 months. Some examples of over the counter antihistamines include Zyrtec (cetirizine), Xyzal (levocetirizine), Allegra (fexofenadine), and Claritin (loratidine).  A sample of Ryaltris has been provided. Use 2 sprays in each nostril twice a day. This is a combination of a nasal steroid and an antihistamine.  Begin saline nasal rinses as needed for nasal symptoms. Use this before any medicated nasal sprays for best result  Reflux Continue dietary and lifestyle modifications as listed below Begin famotidine 20 mg once a day if needed for reflux control  Call the clinic if this treatment plan is not working well for you.  Follow up in 4 months or sooner if needed.  Control of Dust Mite Allergen Dust mites play a major role in allergic asthma and rhinitis. They occur in environments with high humidity wherever human skin is found. Dust mites absorb humidity from the atmosphere (ie, they do not drink) and feed on organic matter (including shed human and animal skin). Dust mites are a microscopic type of insect that you cannot see with the naked eye. High levels of dust mites have been detected from mattresses, pillows, carpets, upholstered furniture, bed covers, clothes, soft toys and any woven material. The principal allergen of the dust mite is found in its feces. A gram of dust may contain 1,000 mites and 250,000 fecal particles. Mite antigen is easily measured in the air during house  cleaning activities. Dust mites do not bite and do not cause harm to humans, other than by triggering allergies/asthma.  Ways to decrease your exposure to dust mites in your home:  1. Encase mattresses, box springs and pillows with a mite-impermeable barrier or cover  2. Wash sheets, blankets and drapes weekly in hot water (130 F) with detergent and dry them in a dryer on the hot setting.  3. Have the room cleaned frequently with a vacuum cleaner and a damp dust-mop. For carpeting or rugs, vacuuming with a vacuum cleaner equipped with a high-efficiency particulate air (HEPA) filter. The dust mite allergic individual should not be in a room which is being cleaned and should wait 1 hour after cleaning before going into the room.  4. Do not sleep on upholstered furniture (eg, couches).  5. If possible removing carpeting, upholstered furniture and drapery from the home is ideal. Horizontal blinds should be eliminated in the rooms where the person spends the most time (bedroom, study, television room). Washable vinyl, roller-type shades are optimal.  6. Remove all non-washable stuffed toys from the bedroom. Wash stuffed toys weekly like sheets and blankets above.  7. Reduce indoor humidity to less than 50%. Inexpensive humidity monitors can be purchased at most hardware stores. Do not use a humidifier as can make the problem worse and are not recommended.  Control of Cockroach Allergen Cockroach allergen has been identified as an important cause of acute attacks of asthma, especially in urban settings.  There are fifty-five species of cockroach that exist in the [place], however only three, the [employer],  [place] and [place] species produce allergen that can affect patients with Asthma.  Allergens can be obtained from fecal particles, egg casings and secretions from cockroaches.    Remove food sources. Reduce access to water. Seal access and entry points. Spray runways with 0.5-1% Diazinon  or Chlorpyrifos Blow boric acid power under stoves and refrigerator. Place bait stations (hydramethylnon) at feeding sites.

## 2021-08-21 ENCOUNTER — Encounter: Payer: Self-pay | Admitting: Family Medicine

## 2021-08-21 ENCOUNTER — Ambulatory Visit (INDEPENDENT_AMBULATORY_CARE_PROVIDER_SITE_OTHER): Payer: 59 | Admitting: Family Medicine

## 2021-08-21 ENCOUNTER — Other Ambulatory Visit: Payer: Self-pay

## 2021-08-21 VITALS — BP 124/80 | HR 88 | Temp 98.8°F | Resp 17 | Ht 61.0 in | Wt 146.2 lb

## 2021-08-21 DIAGNOSIS — K219 Gastro-esophageal reflux disease without esophagitis: Secondary | ICD-10-CM

## 2021-08-21 DIAGNOSIS — R053 Chronic cough: Secondary | ICD-10-CM

## 2021-08-21 DIAGNOSIS — J3089 Other allergic rhinitis: Secondary | ICD-10-CM | POA: Diagnosis not present

## 2021-08-21 MED ORDER — MONTELUKAST SODIUM 10 MG PO TABS: 10.0000 mg | ORAL_TABLET | Freq: Every day | ORAL | 1 refills | Status: DC

## 2021-08-21 MED ORDER — CETIRIZINE HCL 10 MG PO TABS: 10.0000 mg | ORAL_TABLET | Freq: Every day | ORAL | 3 refills | Status: DC

## 2021-12-10 ENCOUNTER — Ambulatory Visit: Payer: 59 | Admitting: Family

## 2021-12-17 ENCOUNTER — Ambulatory Visit: Payer: 59 | Admitting: Family

## 2021-12-20 NOTE — Progress Notes (Signed)
? ?Follow Up Note ? ?RE: Kathleen Conway MRN: [id number] DOB: [birth year] ?Date of Office Visit: 12/21/2021 ? ?Referring provider: No ref. provider found ?Primary care provider: Pcp, No ? ?Chief Complaint: Asthma and Allergic Rhinitis  ? ?History of Present Illness: ?I had the pleasure of seeing Kathleen Conway for a follow up visit at the [facility] on 12/21/2021. She is a 41 y.o. female, who is being followed for cough, allergic rhinitis, reflux. Her previous allergy office visit was on 08/21/2021 with Gareth Morgan, [place]. Today is a regular follow up visit. ? ?Cough ?Currently taking Singulair '10mg'$  daily and zyrtec '10mg'$  daily and the coughing resolved. ?No rescue inhaler use.  ?  ?Allergic rhinitis ?Taking Singulair, zyrtec daily. ? ?Some nasal drainage and itchy eyes.  ?Likes Ryaltris and would like Rx.  ?  ?Reflux ?Asymptomatic and did not take famotidine.  ? ?Assessment and Plan: ?Kathleen Conway is a 41 y.o. female with: ?Chronic cough ?Past history - Dry coughing with posttussive emesis for the past 2 years.  Patient was seen by pulmonology and gastroenterology.  Work-up for reflux was negative and treated for H. pylori.  Chest x-ray was normal. Main triggers are exertion and after eating. 2021 skin testing showed: Positive to dust mites, cockroaches. 2021 breathing test showed normal with no improvement after breathing treatment.  ?Interim history - coughing resolved with Singulair and zyrtec. No albuterol use. ?Today's spirometry showed some restriction. ?Monitor symptoms. ?Continue Singulair (montelukast) '10mg'$  daily at night. ?May use albuterol rescue inhaler 2 puffs or nebulizer every 4 to 6 hours as needed for shortness of breath, chest tightness, coughing, and wheezing. Monitor frequency of use.  ? ?Perennial allergic rhinitis ?Past history - Rhinoconjunctivitis symptoms for the past 10 years with weather changes.  Tried Zyrtec and Singulair with benefit.  No prior ENT eval. 2021 skin  testing was positive to dust mites and cockroaches. ?Interim history - stable with Singulair and zyrtec. ?Continue environmental control measures.  ?Continue Singulair (montelukast) '10mg'$  daily at night. ?May use over the counter antihistamines such as Zyrtec (cetirizine), Claritin (loratadine), Allegra (fexofenadine), or Xyzal (levocetirizine) daily as needed. ?Start Ryaltris (olopatadine + mometasone nasal spray combination) 1-2 sprays per nostril twice a day. Sample given. ?This will be mailed to you. ?Nasal saline spray (i.e., Simply Saline) or nasal saline lavage (i.e., NeilMed) is recommended as needed and prior to medicated nasal sprays. ?May use over the counter eye drops as needed.  ? ?Return in about 6 months (around 06/22/2022). ? ?Meds ordered this encounter  ?Medications  ? Olopatadine-Mometasone (RYALTRIS) [id number] MCG/ACT SUSP  ?  Sig: Place 1-2 sprays into the nose in the morning and at bedtime.  ?  Dispense:  29 g  ?  Refill:  5  ?  [phone]  ? ?Lab Orders  ?No laboratory test(s) ordered today  ? ? ?Diagnostics: ?Spirometry:  ?Tracings reviewed. Her effort: Good reproducible efforts. ?FVC: 1.75L ?FEV1: 1.54L, 67% predicted ?FEV1/FVC ratio: 88% ?Interpretation: Spirometry consistent with possible restrictive disease.  ?Please see scanned spirometry results for details. ? ?Medication List:  ?Current Outpatient Medications  ?Medication Sig Dispense Refill  ? albuterol (VENTOLIN HFA) 108 (90 Base) MCG/ACT inhaler Inhale 2 puffs into the lungs every 6 (six) hours as needed for wheezing or shortness of breath. 18 g 0  ? cetirizine (ZYRTEC) 10 MG tablet Take 1 tablet (10 mg total) by mouth daily. 90 tablet 3  ? montelukast (SINGULAIR) 10 MG tablet Take 1 tablet (10 mg total) by  mouth at bedtime. 90 tablet 1  ? Olopatadine-Mometasone (RYALTRIS) [id number] MCG/ACT SUSP Place 1-2 sprays into the nose in the morning and at bedtime. 29 g 5  ? rizatriptan (MAXALT) 10 MG tablet Take 1 tablet (10 mg total) by mouth as  needed for migraine. May repeat in 2 hours if needed 10 tablet 5  ? clonazePAM (KLONOPIN) 0.5 MG tablet Take 1 tablet (0.5 mg total) by mouth 2 (two) times daily as needed for anxiety. 45 tablet 1  ? sertraline (ZOLOFT) 100 MG tablet Take 1 tablet (100 mg total) by mouth daily. (Patient not taking: Reported on 08/21/2021) 90 tablet 3  ? ?No current facility-administered medications for this visit.  ? ?Allergies: ?Allergies  ?Allergen Reactions  ? Topamax Nausea And Vomiting  ? ?I reviewed her past medical history, social history, family history, and environmental history and no significant changes have been reported from her previous visit. ? ?Review of Systems  ?Constitutional:  Negative for appetite change, chills, fever and unexpected weight change.  ?HENT:  Negative for congestion and rhinorrhea.   ?Eyes:  Negative for itching.  ?Respiratory:  Negative for cough, chest tightness, shortness of breath and wheezing.   ?Gastrointestinal:  Negative for abdominal pain.  ?Skin:  Negative for rash.  ?Allergic/Immunologic: Positive for environmental allergies.  ?Neurological:  Negative for headaches.  ? ?Objective: ?BP 124/86   Pulse 62   Temp 98.2 ?F (36.8 ?C)   Resp 18   Ht '5\' 1"'$  (1.549 m)   Wt 147 lb 12.8 oz (67 kg)   SpO2 100%   BMI 27.93 kg/m?  ?Body mass index is 27.93 kg/m?Marland Kitchen ?Physical Exam ?Vitals and nursing note reviewed.  ?Constitutional:   ?   Appearance: Normal appearance. She is well-developed.  ?HENT:  ?   Head: Normocephalic and atraumatic.  ?   Right Ear: Tympanic membrane and external ear normal.  ?   Left Ear: Tympanic membrane and external ear normal.  ?   Nose: Nose normal.  ?   Mouth/Throat:  ?   Mouth: Mucous membranes are moist.  ?   Pharynx: Oropharynx is clear.  ?Eyes:  ?   Conjunctiva/sclera: Conjunctivae normal.  ?Cardiovascular:  ?   Rate and Rhythm: Normal rate and regular rhythm.  ?   Heart sounds: Normal heart sounds. No murmur heard. ?Pulmonary:  ?   Effort: Pulmonary effort is  normal.  ?   Breath sounds: Normal breath sounds. No wheezing, rhonchi or rales.  ?Musculoskeletal:  ?   Cervical back: Neck supple.  ?Skin: ?   General: Skin is warm.  ?   Findings: No rash.  ?Neurological:  ?   Mental Status: She is alert and oriented to person, place, and time.  ?Psychiatric:     ?   Behavior: Behavior normal.  ?Previous notes and tests were reviewed. ?The plan was reviewed with the patient/family, and all questions/concerned were addressed. ? ?It was my pleasure to see Kathleen Conway today and participate in her care. Please feel free to contact me with any questions or concerns. ? ?Sincerely, ? ?Rexene Alberts, DO ?Allergy & Immunology ? ?[facility] ?[place] office: [phone] ?[place] office: [phone] ?

## 2021-12-21 ENCOUNTER — Ambulatory Visit: Payer: 59 | Admitting: Allergy

## 2021-12-21 ENCOUNTER — Encounter: Payer: Self-pay | Admitting: Allergy

## 2021-12-21 VITALS — BP 124/86 | HR 62 | Temp 98.2°F | Resp 18 | Ht 61.0 in | Wt 147.8 lb

## 2021-12-21 DIAGNOSIS — R053 Chronic cough: Secondary | ICD-10-CM

## 2021-12-21 DIAGNOSIS — J3089 Other allergic rhinitis: Secondary | ICD-10-CM

## 2021-12-21 DIAGNOSIS — K219 Gastro-esophageal reflux disease without esophagitis: Secondary | ICD-10-CM

## 2021-12-21 MED ORDER — RYALTRIS 665-25 MCG/ACT NA SUSP: 1.0000 | Freq: Two times a day (BID) | NASAL | 5 refills | Status: DC

## 2021-12-21 NOTE — Assessment & Plan Note (Signed)
Past history - Rhinoconjunctivitis symptoms for the past 10 years with weather changes.  Tried Zyrtec and Singulair with benefit.  No prior ENT eval. 2021 skin testing was positive to dust mites and cockroaches. ?Interim history - stable with Singulair and zyrtec. ?? Continue environmental control measures.  ?? Continue Singulair (montelukast) '10mg'$  daily at night. ?? May use over the counter antihistamines such as Zyrtec (cetirizine), Claritin (loratadine), Allegra (fexofenadine), or Xyzal (levocetirizine) daily as needed. ?? Start Ryaltris (olopatadine + mometasone nasal spray combination) 1-2 sprays per nostril twice a day. Sample given. ?? This will be mailed to you. ?? Nasal saline spray (i.e., Simply Saline) or nasal saline lavage (i.e., NeilMed) is recommended as needed and prior to medicated nasal sprays. ?? May use over the counter eye drops as needed.  ?

## 2021-12-21 NOTE — Patient Instructions (Addendum)
Past skin testing showed: Positive to dust mites, cockroaches.  ?Continue environmental control measures.  ?Continue Singulair (montelukast) '10mg'$  daily at night. ?May use over the counter antihistamines such as Zyrtec (cetirizine), Claritin (loratadine), Allegra (fexofenadine), or Xyzal (levocetirizine) daily as needed. ?Start Ryaltris (olopatadine + mometasone nasal spray combination) 1-2 sprays per nostril twice a day. Sample given. ?This will be mailed to you. ?Nasal saline spray (i.e., Simply Saline) or nasal saline lavage (i.e., NeilMed) is recommended as needed and prior to medicated nasal sprays. ?May use over the counter eye drops as needed.  ? ?Coughing:  ?Monitor symptoms. ?Continue Singulair (montelukast) '10mg'$  daily at night. ?May use albuterol rescue inhaler 2 puffs or nebulizer every 4 to 6 hours as needed for shortness of breath, chest tightness, coughing, and wheezing. Monitor frequency of use.  ? ?Follow up in 6 months or sooner if needed.  ? ?Control of House Dust Mite Allergen ?Dust mite allergens are a common trigger of allergy and asthma symptoms. While they can be found throughout the house, these microscopic creatures thrive in warm, humid environments such as bedding, upholstered furniture and carpeting. ?Because so much time is spent in the bedroom, it is essential to reduce mite levels there.  ?Encase pillows, mattresses, and box springs in special allergen-proof fabric covers or airtight, zippered plastic covers.  ?Bedding should be washed weekly in hot water (130? F) and dried in a hot dryer. Allergen-proof covers are available for comforters and pillows that can?t be regularly washed.  ?Wash the allergy-proof covers every few months. Minimize clutter in the bedroom. Keep pets out of the bedroom.  ?Keep humidity less than 50% by using a dehumidifier or air conditioning. You can buy a humidity measuring device called a hygrometer to monitor this.  ?If possible, replace carpets with  hardwood, linoleum, or washable area rugs. If that's not possible, vacuum frequently with a vacuum that has a HEPA filter. ?Remove all upholstered furniture and non-washable window drapes from the bedroom. ?Remove all non-washable stuffed toys from the bedroom.  Wash stuffed toys weekly. ? ?Cockroach Allergen Avoidance ?Cockroaches are often found in the homes of densely populated urban areas, schools or commercial buildings, but these creatures can lurk almost anywhere. This does not mean that you have a dirty house or living area. ?Block all areas where roaches can enter the home. This includes crevices, wall cracks and windows.  ?Cockroaches need water to survive, so fix and seal all leaky faucets and pipes. Have an exterminator go through the house when your family and pets are gone to eliminate any remaining roaches. ?Keep food in lidded containers and put pet food dishes away after your pets are done eating. Vacuum and sweep the floor after meals, and take out garbage and recyclables. Use lidded garbage containers in the kitchen. Wash dishes immediately after use and clean under stoves, refrigerators or toasters where crumbs can accumulate. Wipe off the stove and other kitchen surfaces and cupboards regularly. ? ?

## 2021-12-21 NOTE — Assessment & Plan Note (Signed)
Past history - Dry coughing with posttussive emesis for the past 2 years.  Patient was seen by pulmonology and gastroenterology.  Work-up for reflux was negative and treated for H. pylori.  Chest x-ray was normal. Main triggers are exertion and after eating. 2021 skin testing showed: Positive to dust mites, cockroaches. 2021 breathing test showed normal with no improvement after breathing treatment.  ?Interim history - coughing resolved with Singulair and zyrtec. No albuterol use. ?? Today's spirometry showed some restriction. ?? Monitor symptoms. ?? Continue Singulair (montelukast) '10mg'$  daily at night. ?? May use albuterol rescue inhaler 2 puffs or nebulizer every 4 to 6 hours as needed for shortness of breath, chest tightness, coughing, and wheezing. Monitor frequency of use.  ?

## 2022-02-05 ENCOUNTER — Encounter: Payer: Self-pay | Admitting: Physician Assistant

## 2022-02-05 ENCOUNTER — Ambulatory Visit: Payer: 59 | Admitting: Physician Assistant

## 2022-02-05 VITALS — BP 137/85 | HR 66 | Temp 98.1°F | Ht 61.0 in | Wt 153.2 lb

## 2022-02-05 DIAGNOSIS — E7800 Pure hypercholesterolemia, unspecified: Secondary | ICD-10-CM | POA: Diagnosis not present

## 2022-02-05 DIAGNOSIS — Z0000 Encounter for general adult medical examination without abnormal findings: Secondary | ICD-10-CM

## 2022-02-05 DIAGNOSIS — F32A Depression, unspecified: Secondary | ICD-10-CM

## 2022-02-05 DIAGNOSIS — F419 Anxiety disorder, unspecified: Secondary | ICD-10-CM | POA: Diagnosis not present

## 2022-02-05 DIAGNOSIS — D229 Melanocytic nevi, unspecified: Secondary | ICD-10-CM

## 2022-02-05 LAB — LIPID PANEL
Cholesterol: 204 mg/dL — ABNORMAL HIGH (ref 0–200)
HDL: 59.1 mg/dL (ref >39.00)
LDL Cholesterol: 133 mg/dL — ABNORMAL HIGH (ref 0–99)
NonHDL: 145.35
Total CHOL/HDL Ratio: 3
Triglycerides: 63 mg/dL (ref 0.0–149.0)
VLDL: 12.6 mg/dL (ref 0.0–40.0)

## 2022-02-05 LAB — CBC WITH DIFFERENTIAL/PLATELET
Basophils Absolute: 0.1 10*3/uL (ref 0.0–0.1)
Basophils Relative: 0.8 % (ref 0.0–3.0)
Eosinophils Absolute: 0.1 10*3/uL (ref 0.0–0.7)
Eosinophils Relative: 1.5 % (ref 0.0–5.0)
HCT: 38.6 % (ref 36.0–46.0)
Hemoglobin: 13 g/dL (ref 12.0–15.0)
Lymphocytes Relative: 34.1 % (ref 12.0–46.0)
Lymphs Abs: 2.1 10*3/uL (ref 0.7–4.0)
MCHC: 33.6 g/dL (ref 30.0–36.0)
MCV: 92.3 fl (ref 78.0–100.0)
Monocytes Absolute: 0.5 10*3/uL (ref 0.1–1.0)
Monocytes Relative: 7.6 % (ref 3.0–12.0)
Neutro Abs: 3.5 10*3/uL (ref 1.4–7.7)
Neutrophils Relative %: 56 % (ref 43.0–77.0)
Platelets: 302 10*3/uL (ref 150.0–400.0)
RBC: 4.18 Mil/uL (ref 3.87–5.11)
RDW: 13.3 % (ref 11.5–15.5)
WBC: 6.2 10*3/uL (ref 4.0–10.5)

## 2022-02-05 LAB — COMPREHENSIVE METABOLIC PANEL
ALT: 10 U/L (ref 0–35)
AST: 12 U/L (ref 0–37)
Albumin: 4.3 g/dL (ref 3.5–5.2)
Alkaline Phosphatase: 63 U/L (ref 39–117)
BUN: 10 mg/dL (ref 6–23)
CO2: 30 mEq/L (ref 19–32)
Calcium: 9.5 mg/dL (ref 8.4–10.5)
Chloride: 105 mEq/L (ref 96–112)
Creatinine, Ser: 0.95 mg/dL (ref 0.40–1.20)
GFR: 73.49 mL/min (ref >60.00)
Glucose, Bld: 90 mg/dL (ref 70–99)
Potassium: 3.8 mEq/L (ref 3.5–5.1)
Sodium: 140 mEq/L (ref 135–145)
Total Bilirubin: 0.3 mg/dL (ref 0.2–1.2)
Total Protein: 7.1 g/dL (ref 6.0–8.3)

## 2022-02-05 NOTE — Progress Notes (Signed)
Subjective:    Patient ID: Kathleen Conway, female    DOB: 29-Nov-1980, 41 y.o.   MRN: [id number]  Chief Complaint  Patient presents with   Annual Exam    Pt is not fasting.    Establish Care    HPI 41 y.o. patient presents today for new patient establishment with me.  Patient was previously established with [facility] with [place]. 3 kids at home, oldest about to graduate from [employer]. Happily married. Works for [employer].  Current Care Team: Allergist  [facility]    Acute concerns: No health concerns   Health maintenance: Lifestyle/ exercise: Walking at work  Nutrition: Doesn't eat breakfast, does well otherwise Mental health: hx anxiety and depression; managing without medication right now; recently came off Zoloft; klonopin only as needed  Caffeine: Trying to cut back, usually once per day- helps with migraines  Sleep: Sleeping well  Substance use: None ETOH: once every few months, very occasional  Sexual activity: Monogamous, no concerns  Immunizations: Youngest child is 5 years old  Colonoscopy: Plan to start at age 41  Pap: GYN - one abnormal years ago  Mammogram: [place], December 21, 2021; has to go back in 6 months; mom passed from breast cancer    Past Medical History:  Diagnosis Date   Allergy    Anxiety    Anxiety and depression 08/15/2019   Asthma    Carpal tunnel syndrome during pregnancy    Depression    Phreesia 10/12/2020   GERD (gastroesophageal reflux disease)    History of Helicobacter pylori infection 08/15/2019   Hx of varicella    Migraines    Postpartum care following vaginal delivery (11/4) 07/26/2015   Vaginal Pap smear, abnormal    Vitamin D deficiency 08/15/2019    Past Surgical History:  Procedure Laterality Date   24 HOUR [place] STUDY N/A 07/02/2019   Procedure: 24 HOUR [place] STUDY;  Surgeon: Irene Shipper, MD;  Location: [facility];  Service: Endoscopy;  Laterality: N/A;   DILATION AND EVACUATION N/A 08/20/2014    Procedure: DILATATION AND EVACUATION;  Surgeon: Princess Bruins, MD;  Location: [facility];  Service: Gynecology;  Laterality: N/A;   ESOPHAGEAL MANOMETRY N/A 07/02/2019   Procedure: ESOPHAGEAL MANOMETRY (EM);  Surgeon: Irene Shipper, MD;  Location: [facility];  Service: Endoscopy;  Laterality: N/A;   [place] IMPEDANCE STUDY N/A 07/02/2019   Procedure: [place] IMPEDANCE STUDY;  Surgeon: Irene Shipper, MD;  Location: [facility];  Service: Endoscopy;  Laterality: N/A;   WISDOM TOOTH EXTRACTION      Family History  Problem Relation Age of Onset   Cancer Mother        breast   Cancer Father        prostate   Stroke Maternal Grandfather    Cancer Maternal Grandfather        colon- 68s   Cancer Paternal Grandfather    Stroke Maternal Grandmother    Heart attack Paternal Grandmother    Cancer Maternal Aunt        Breast and lung   Cancer Maternal Uncle        Lung   Down syndrome Paternal Aunt     Social History   Tobacco Use   Smoking status: Never   Smokeless tobacco: Never  Vaping Use   Vaping Use: Never used  Substance Use Topics   Alcohol use: Yes    Comment: socially-wine but none with pregnancy   Drug use: No  Allergies  Allergen Reactions   Topamax Nausea And Vomiting    Review of Systems NEGATIVE UNLESS OTHERWISE INDICATED IN HPI      Objective:     BP 137/85   Pulse 66   Temp 98.1 F (36.7 C) (Temporal)   Ht '5\' 1"'$  (1.549 m)   Wt 153 lb 3.2 oz (69.5 kg)   LMP 01/28/2022 (Exact Date)   SpO2 100%   BMI 28.95 kg/m   Wt Readings from Last 3 Encounters:  02/05/22 153 lb 3.2 oz (69.5 kg)  12/21/21 147 lb 12.8 oz (67 kg)  08/21/21 146 lb 3.2 oz (66.3 kg)    BP Readings from Last 3 Encounters:  02/05/22 137/85  12/21/21 124/86  08/21/21 124/80     Physical Exam Vitals and nursing note reviewed.  Constitutional:      Appearance: Normal appearance. She is normal weight. She is not toxic-appearing.  HENT:     Head: Normocephalic and atraumatic.      Right Ear: Tympanic membrane, ear canal and external ear normal.     Left Ear: Tympanic membrane, ear canal and external ear normal.     Nose: Nose normal.     Mouth/Throat:     Mouth: Mucous membranes are moist.  Eyes:     Extraocular Movements: Extraocular movements intact.     Conjunctiva/sclera: Conjunctivae normal.     Pupils: Pupils are equal, round, and reactive to light.  Cardiovascular:     Rate and Rhythm: Normal rate and regular rhythm.     Pulses: Normal pulses.     Heart sounds: Normal heart sounds.  Pulmonary:     Effort: Pulmonary effort is normal.     Breath sounds: Normal breath sounds.  Abdominal:     General: Abdomen is flat. Bowel sounds are normal.     Palpations: Abdomen is soft.  Musculoskeletal:        General: Normal range of motion.     Cervical back: Normal range of motion and neck supple.  Skin:    General: Skin is warm and dry.     Comments: Left lateral abdomen / back there is a 2-3 cm raised hyperpigmented skin tag - been there as long as she can remember.   Neurological:     General: No focal deficit present.     Mental Status: She is alert and oriented to person, place, and time.  Psychiatric:        Mood and Affect: Mood normal.        Behavior: Behavior normal.        Thought Content: Thought content normal.        Judgment: Judgment normal.       Assessment & Plan:   Problem List Items Addressed This Visit       Other   Anxiety and depression (Chronic)   Elevated LDL cholesterol level   Relevant Orders   Lipid panel   Other Visit Diagnoses     Encounter for annual physical exam    -  Primary   Relevant Orders   CBC with Differential/Platelet   Comprehensive metabolic panel   Lipid panel   Benign mole          Age-appropriate screening and counseling performed today. Will check labs and call with results. Preventive measures discussed and printed in AVS for patient.  -Mammo and pap UTD -Work on lifestyle - exercise,  nutrition -Mental health stable, consider counseling with son about to graduate -Vision /  dental UTD -Consider shave biopsy     Return in about 1 year (around 02/06/2023) for CPE / fasting labs .  This note was prepared with assistance of Systems analyst. Occasional wrong-word or sound-a-like substitutions may have occurred due to the inherent limitations of voice recognition software.   Rosmary Dionisio M Adonay Scheier, PA-C

## 2022-02-05 NOTE — Patient Instructions (Addendum)
Good to meet you today! Please go to the lab for blood work and I will send results through [place].  Call insurance and ask about shave biopsy procedure of benign mole.  Look into [employer] app for counseling "on-the-go" - be proactive with all the changes coming up soon.   Call anytime!

## 2022-02-08 ENCOUNTER — Telehealth: Payer: Self-pay | Admitting: Physician Assistant

## 2022-02-08 NOTE — Telephone Encounter (Signed)
See Lab note

## 2022-02-08 NOTE — Telephone Encounter (Signed)
Pt states returning a phone call  Pt states she is at work and there is no set "good time to call" but she will be watching her phone to pick up when call is returned.

## 2022-02-10 LAB — HM PAP SMEAR: HM Pap smear: NEGATIVE

## 2022-02-10 LAB — RESULTS CONSOLE HPV: CHL HPV: NEGATIVE

## 2022-02-16 ENCOUNTER — Other Ambulatory Visit: Payer: Self-pay | Admitting: Obstetrics & Gynecology

## 2022-02-16 DIAGNOSIS — Z9189 Other specified personal risk factors, not elsewhere classified: Secondary | ICD-10-CM

## 2022-02-16 DIAGNOSIS — Z803 Family history of malignant neoplasm of breast: Secondary | ICD-10-CM

## 2022-03-12 ENCOUNTER — Encounter: Payer: Self-pay | Admitting: Physician Assistant

## 2022-03-18 ENCOUNTER — Telehealth: Payer: 59 | Admitting: Physician Assistant

## 2022-03-18 ENCOUNTER — Encounter: Payer: Self-pay | Admitting: Physician Assistant

## 2022-03-18 DIAGNOSIS — F419 Anxiety disorder, unspecified: Secondary | ICD-10-CM

## 2022-03-18 DIAGNOSIS — F32A Depression, unspecified: Secondary | ICD-10-CM

## 2022-03-18 MED ORDER — ONDANSETRON HCL 4 MG PO TABS: 4.0000 mg | ORAL_TABLET | Freq: Three times a day (TID) | ORAL | 0 refills | Status: DC | PRN

## 2022-03-18 MED ORDER — SERTRALINE HCL 50 MG PO TABS
50.0000 mg | ORAL_TABLET | Freq: Every day | ORAL | 3 refills | Status: DC
Start: 2022-03-18 — End: 2023-02-10

## 2022-03-18 NOTE — Progress Notes (Signed)
   Virtual Visit via Video Note  I connected with  Kathleen Conway  on 03/18/22 at  1:30 PM EDT by a video enabled telemedicine application and verified that I am speaking with the correct person using two identifiers.  Location: Patient: home Provider: Therapist, music at [place] present: Patient and myself   I discussed the limitations of evaluation and management by telemedicine and the availability of in person appointments. The patient expressed understanding and agreed to proceed.   History of Present Illness:  41 yo female presents for virtual visit to discuss anxiety and depression. Wanting to go back on Zoloft. Has been off at least the last year, but feels symptoms returning. Staying tired, more difficult to get out of the bed, starting to worry about son (going off to college), denies any SI/HI. Wants to get ahead of it before symptoms get worse.   Observations/Objective:   Gen: Awake, alert, no acute distress Resp: Breathing is even and non-labored Psych: calm/pleasant demeanor Neuro: Alert and Oriented x 3, + facial symmetry, speech is clear.   Assessment and Plan:  1. Anxiety and depression Flowsheet Row Video Visit from 03/18/2022 in [facility]  PHQ-9 Total Score 14      -start back on Zoloft 50 mg daily. Pt aware of risks vs benefits and possible adverse reactions. -Zofran prn in case of nausea (which happened last time she first started medication) -Counseling advised  F/up 3-6 months or prn    Follow Up Instructions:    I discussed the assessment and treatment plan with the patient. The patient was provided an opportunity to ask questions and all were answered. The patient agreed with the plan and demonstrated an understanding of the instructions.   The patient was advised to call back or seek an in-person evaluation if the symptoms worsen or if the condition fails to improve as anticipated.  Suttyn Cryder  M Rosabel Sermeno, PA-C

## 2022-04-13 ENCOUNTER — Other Ambulatory Visit: Payer: Self-pay | Admitting: Family Medicine

## 2022-06-14 ENCOUNTER — Encounter: Payer: Self-pay | Admitting: *Deleted

## 2022-06-27 NOTE — Progress Notes (Unsigned)
Follow Up Note  RE: Kathleen Conway MRN: [id number] DOB: 1981-04-04 Date of Office Visit: 06/28/2022  Referring provider: No ref. provider found Primary care provider: Allwardt, Randa Evens, PA-C  Chief Complaint: No chief complaint on file.  History of Present Illness: I had the pleasure of seeing Kathleen Conway for a follow up visit at the [facility] on 06/27/2022. She is a 41 y.o. female, who is being followed for chronic cough and allergic rhinitis. Her previous allergy office visit was on 12/21/2021 with Dr. Maudie Mercury. Today is a regular follow up visit.  Chronic cough Past history - Dry coughing with posttussive emesis for the past 2 years.  Patient was seen by pulmonology and gastroenterology.  Work-up for reflux was negative and treated for H. pylori.  Chest x-ray was normal. Main triggers are exertion and after eating. 2021 skin testing showed: Positive to dust mites, cockroaches. 2021 breathing test showed normal with no improvement after breathing treatment.  Interim history - coughing resolved with Singulair and zyrtec. No albuterol use. Today's spirometry showed some restriction. Monitor symptoms. Continue Singulair (montelukast) '10mg'$  daily at night. May use albuterol rescue inhaler 2 puffs or nebulizer every 4 to 6 hours as needed for shortness of breath, chest tightness, coughing, and wheezing. Monitor frequency of use.    Perennial allergic rhinitis Past history - Rhinoconjunctivitis symptoms for the past 10 years with weather changes.  Tried Zyrtec and Singulair with benefit.  No prior ENT eval. 2021 skin testing was positive to dust mites and cockroaches. Interim history - stable with Singulair and zyrtec. Continue environmental control measures.  Continue Singulair (montelukast) '10mg'$  daily at night. May use over the counter antihistamines such as Zyrtec (cetirizine), Claritin (loratadine), Allegra (fexofenadine), or Xyzal (levocetirizine) daily as  needed. Start Ryaltris (olopatadine + mometasone nasal spray combination) 1-2 sprays per nostril twice a day. Sample given. This will be mailed to you. Nasal saline spray (i.e., Simply Saline) or nasal saline lavage (i.e., NeilMed) is recommended as needed and prior to medicated nasal sprays. May use over the counter eye drops as needed.    Return in about 6 months (around 06/22/2022).  Assessment and Plan: Kathleen Conway is a 41 y.o. female with: No problem-specific Assessment & Plan notes found for this encounter.  No follow-ups on file.  No orders of the defined types were placed in this encounter.  Lab Orders  No laboratory test(s) ordered today    Diagnostics: Spirometry:  Tracings reviewed. Her effort: {Blank single:19197::"Good reproducible efforts.","It was hard to get consistent efforts and there is a question as to whether this reflects a maximal maneuver.","Poor effort, data can not be interpreted."} FVC: ***L FEV1: ***L, ***% predicted FEV1/FVC ratio: ***% Interpretation: {Blank single:19197::"Spirometry consistent with mild obstructive disease","Spirometry consistent with moderate obstructive disease","Spirometry consistent with severe obstructive disease","Spirometry consistent with possible restrictive disease","Spirometry consistent with mixed obstructive and restrictive disease","Spirometry uninterpretable due to technique","Spirometry consistent with normal pattern","No overt abnormalities noted given today's efforts"}.  Please see scanned spirometry results for details.  Skin Testing: {Blank single:19197::"Select foods","Environmental allergy panel","Environmental allergy panel and select foods","Food allergy panel","None","Deferred due to recent antihistamines use"}. *** Results discussed with patient/family.   Medication List:  Current Outpatient Medications  Medication Sig Dispense Refill  . montelukast (SINGULAIR) 10 MG tablet TAKE 1 TABLET(10 MG) BY MOUTH AT  BEDTIME 90 tablet 0  . albuterol (VENTOLIN HFA) 108 (90 Base) MCG/ACT inhaler Inhale 2 puffs into the lungs every 6 (six) hours as needed for wheezing or  shortness of breath. 18 g 0  . cetirizine (ZYRTEC) 10 MG tablet Take 1 tablet (10 mg total) by mouth daily. 90 tablet 3  . clonazePAM (KLONOPIN) 0.5 MG tablet Take 1 tablet (0.5 mg total) by mouth 2 (two) times daily as needed for anxiety. 45 tablet 1  . Olopatadine-Mometasone (RYALTRIS) [id number] MCG/ACT SUSP Place 1-2 sprays into the nose in the morning and at bedtime. 29 g 5  . ondansetron (ZOFRAN) 4 MG tablet Take 1 tablet (4 mg total) by mouth every 8 (eight) hours as needed for nausea or vomiting. 30 tablet 0  . rizatriptan (MAXALT) 10 MG tablet Take 1 tablet (10 mg total) by mouth as needed for migraine. May repeat in 2 hours if needed 10 tablet 5  . sertraline (ZOLOFT) 100 MG tablet Take 1 tablet (100 mg total) by mouth daily. 90 tablet 3  . sertraline (ZOLOFT) 50 MG tablet Take 1 tablet (50 mg total) by mouth at bedtime. 90 tablet 3   No current facility-administered medications for this visit.   Allergies: Allergies  Allergen Reactions  . Topamax Nausea And Vomiting   I reviewed her past medical history, social history, family history, and environmental history and no significant changes have been reported from her previous visit.  Review of Systems  Constitutional:  Negative for appetite change, chills, fever and unexpected weight change.  HENT:  Negative for congestion and rhinorrhea.   Eyes:  Negative for itching.  Respiratory:  Negative for cough, chest tightness, shortness of breath and wheezing.   Gastrointestinal:  Negative for abdominal pain.  Skin:  Negative for rash.  Allergic/Immunologic: Positive for environmental allergies.  Neurological:  Negative for headaches.   Objective: There were no vitals taken for this visit. There is no height or weight on file to calculate BMI. Physical Exam Vitals and nursing note  reviewed.  Constitutional:      Appearance: Normal appearance. She is well-developed.  HENT:     Head: Normocephalic and atraumatic.     Right Ear: Tympanic membrane and external ear normal.     Left Ear: Tympanic membrane and external ear normal.     Nose: Nose normal.     Mouth/Throat:     Mouth: Mucous membranes are moist.     Pharynx: Oropharynx is clear.  Eyes:     Conjunctiva/sclera: Conjunctivae normal.  Cardiovascular:     Rate and Rhythm: Normal rate and regular rhythm.     Heart sounds: Normal heart sounds. No murmur heard. Pulmonary:     Effort: Pulmonary effort is normal.     Breath sounds: Normal breath sounds. No wheezing, rhonchi or rales.  Musculoskeletal:     Cervical back: Neck supple.  Skin:    General: Skin is warm.     Findings: No rash.  Neurological:     Mental Status: She is alert and oriented to person, place, and time.  Psychiatric:        Behavior: Behavior normal.  Previous notes and tests were reviewed. The plan was reviewed with the patient/family, and all questions/concerned were addressed.  It was my pleasure to see Kathleen Conway today and participate in her care. Please feel free to contact me with any questions or concerns.  Sincerely,  Rexene Alberts, DO Allergy & Immunology  [facility] office: [place] office: [phone]

## 2022-06-28 ENCOUNTER — Encounter: Payer: Self-pay | Admitting: Allergy

## 2022-06-28 ENCOUNTER — Ambulatory Visit (INDEPENDENT_AMBULATORY_CARE_PROVIDER_SITE_OTHER): Payer: 59 | Admitting: Allergy

## 2022-06-28 VITALS — BP 118/70 | HR 89 | Temp 98.4°F | Resp 18 | Ht 61.02 in | Wt 150.4 lb

## 2022-06-28 DIAGNOSIS — J3089 Other allergic rhinitis: Secondary | ICD-10-CM | POA: Diagnosis not present

## 2022-06-28 DIAGNOSIS — R053 Chronic cough: Secondary | ICD-10-CM | POA: Diagnosis not present

## 2022-06-28 MED ORDER — MONTELUKAST SODIUM 10 MG PO TABS: ORAL_TABLET | ORAL | 3 refills | Status: DC

## 2022-06-28 MED ORDER — CETIRIZINE HCL 10 MG PO TABS: 10.0000 mg | ORAL_TABLET | Freq: Every day | ORAL | 3 refills | Status: DC

## 2022-06-28 NOTE — Assessment & Plan Note (Signed)
Past history - Dry coughing with posttussive emesis for the past 2 years.  Patient was seen by pulmonology and gastroenterology.  Work-up for reflux was negative and treated for H. pylori.  Chest x-ray was normal. Main triggers are exertion and after eating. 2021 skin testing showed: Positive to dust mites, cockroaches. 2021 breathing test showed normal with no improvement after breathing treatment.  Interim history - coughing resolved with Singulair and zyrtec and unable to wean off.  No albuterol use.  Today's spirometry was normal.  Monitor symptoms.  Continue Singulair (montelukast) '10mg'$  daily at night.  May use albuterol rescue inhaler 2 puffs or nebulizer every 4 to 6 hours as needed for shortness of breath, chest tightness, coughing, and wheezing. Monitor frequency of use.   Get flu shot in the fall.

## 2022-06-28 NOTE — Assessment & Plan Note (Signed)
Past history - Rhinoconjunctivitis symptoms for the past 10 years with weather changes.  Tried Zyrtec and Singulair with benefit.  No prior ENT eval. 2021 skin testing was positive to dust mites and cockroaches. Interim history - stable with Singulair and zyrtec. Rare nasal spray use.   Continue environmental control measures.   Continue Singulair (montelukast) '10mg'$  daily at night.  May use over the counter antihistamines such as Zyrtec (cetirizine), Claritin (loratadine), Allegra (fexofenadine), or Xyzal (levocetirizine) daily as needed.  May use Ryaltris (olopatadine + mometasone nasal spray combination) 1-2 sprays per nostril twice a day as needed.   Nasal saline spray (i.e., Simply Saline) or nasal saline lavage (i.e., NeilMed) is recommended as needed and prior to medicated nasal sprays.  May use over the counter eye drops as needed.

## 2022-06-28 NOTE — Patient Instructions (Addendum)
Past skin testing showed: Positive to dust mites, cockroaches.  Continue environmental control measures.  Continue Singulair (montelukast) '10mg'$  daily at night. May use over the counter antihistamines such as Zyrtec (cetirizine), Claritin (loratadine), Allegra (fexofenadine), or Xyzal (levocetirizine) daily as needed. May use Ryaltris (olopatadine + mometasone nasal spray combination) 1-2 sprays per nostril twice a day as needed.  Nasal saline spray (i.e., Simply Saline) or nasal saline lavage (i.e., NeilMed) is recommended as needed and prior to medicated nasal sprays. May use over the counter eye drops as needed.   Coughing:  Monitor symptoms. Continue Singulair (montelukast) '10mg'$  daily at night. May use albuterol rescue inhaler 2 puffs or nebulizer every 4 to 6 hours as needed for shortness of breath, chest tightness, coughing, and wheezing. Monitor frequency of use.   Follow up in 12 months or sooner if needed.  Get flu shot in the fall.   Control of House Dust Mite Allergen Dust mite allergens are a common trigger of allergy and asthma symptoms. While they can be found throughout the house, these microscopic creatures thrive in warm, humid environments such as bedding, upholstered furniture and carpeting. Because so much time is spent in the bedroom, it is essential to reduce mite levels there.  Encase pillows, mattresses, and box springs in special allergen-proof fabric covers or airtight, zippered plastic covers.  Bedding should be washed weekly in hot water (130 F) and dried in a hot dryer. Allergen-proof covers are available for comforters and pillows that can't be regularly washed.  Wash the allergy-proof covers every few months. Minimize clutter in the bedroom. Keep pets out of the bedroom.  Keep humidity less than 50% by using a dehumidifier or air conditioning. You can buy a humidity measuring device called a hygrometer to monitor this.  If possible, replace carpets with hardwood,  linoleum, or washable area rugs. If that's not possible, vacuum frequently with a vacuum that has a HEPA filter. Remove all upholstered furniture and non-washable window drapes from the bedroom. Remove all non-washable stuffed toys from the bedroom.  Wash stuffed toys weekly.  Cockroach Allergen Avoidance Cockroaches are often found in the homes of densely populated urban areas, schools or commercial buildings, but these creatures can lurk almost anywhere. This does not mean that you have a dirty house or living area. Block all areas where roaches can enter the home. This includes crevices, wall cracks and windows.  Cockroaches need water to survive, so fix and seal all leaky faucets and pipes. Have an exterminator go through the house when your family and pets are gone to eliminate any remaining roaches. Keep food in lidded containers and put pet food dishes away after your pets are done eating. Vacuum and sweep the floor after meals, and take out garbage and recyclables. Use lidded garbage containers in the kitchen. Wash dishes immediately after use and clean under stoves, refrigerators or toasters where crumbs can accumulate. Wipe off the stove and other kitchen surfaces and cupboards regularly.

## 2022-07-20 ENCOUNTER — Telehealth: Payer: 59 | Admitting: Physician Assistant

## 2022-09-02 ENCOUNTER — Encounter: Payer: Self-pay | Admitting: *Deleted

## 2022-12-27 LAB — HM MAMMOGRAPHY

## 2023-02-10 ENCOUNTER — Encounter: Payer: Self-pay | Admitting: Physician Assistant

## 2023-02-10 ENCOUNTER — Ambulatory Visit (INDEPENDENT_AMBULATORY_CARE_PROVIDER_SITE_OTHER): Payer: 59 | Admitting: Physician Assistant

## 2023-02-10 VITALS — BP 110/72 | HR 70 | Temp 97.3°F | Ht 61.0 in | Wt 153.4 lb

## 2023-02-10 DIAGNOSIS — Z0000 Encounter for general adult medical examination without abnormal findings: Secondary | ICD-10-CM

## 2023-02-10 DIAGNOSIS — E559 Vitamin D deficiency, unspecified: Secondary | ICD-10-CM

## 2023-02-10 DIAGNOSIS — F419 Anxiety disorder, unspecified: Secondary | ICD-10-CM

## 2023-02-10 DIAGNOSIS — F32A Depression, unspecified: Secondary | ICD-10-CM

## 2023-02-10 DIAGNOSIS — R5383 Other fatigue: Secondary | ICD-10-CM | POA: Insufficient documentation

## 2023-02-10 DIAGNOSIS — J302 Other seasonal allergic rhinitis: Secondary | ICD-10-CM

## 2023-02-10 LAB — COMPREHENSIVE METABOLIC PANEL
ALT: 9 U/L (ref 0–35)
AST: 12 U/L (ref 0–37)
Albumin: 4.1 g/dL (ref 3.5–5.2)
Alkaline Phosphatase: 57 U/L (ref 39–117)
BUN: 10 mg/dL (ref 6–23)
CO2: 27 mEq/L (ref 19–32)
Calcium: 9.3 mg/dL (ref 8.4–10.5)
Chloride: 105 mEq/L (ref 96–112)
Creatinine, Ser: 0.99 mg/dL (ref 0.40–1.20)
GFR: 69.45 mL/min (ref >60.00)
Glucose, Bld: 88 mg/dL (ref 70–99)
Potassium: 4.3 mEq/L (ref 3.5–5.1)
Sodium: 138 mEq/L (ref 135–145)
Total Bilirubin: 0.5 mg/dL (ref 0.2–1.2)
Total Protein: 6.7 g/dL (ref 6.0–8.3)

## 2023-02-10 LAB — CBC WITH DIFFERENTIAL/PLATELET
Basophils Absolute: 0 10*3/uL (ref 0.0–0.1)
Basophils Relative: 0.7 % (ref 0.0–3.0)
Eosinophils Absolute: 0.1 10*3/uL (ref 0.0–0.7)
Eosinophils Relative: 1.4 % (ref 0.0–5.0)
HCT: 39.8 % (ref 36.0–46.0)
Hemoglobin: 13 g/dL (ref 12.0–15.0)
Lymphocytes Relative: 28 % (ref 12.0–46.0)
Lymphs Abs: 1.6 10*3/uL (ref 0.7–4.0)
MCHC: 32.5 g/dL (ref 30.0–36.0)
MCV: 92.9 fl (ref 78.0–100.0)
Monocytes Absolute: 0.5 10*3/uL (ref 0.1–1.0)
Monocytes Relative: 8.1 % (ref 3.0–12.0)
Neutro Abs: 3.6 10*3/uL (ref 1.4–7.7)
Neutrophils Relative %: 61.8 % (ref 43.0–77.0)
Platelets: 255 10*3/uL (ref 150.0–400.0)
RBC: 4.29 Mil/uL (ref 3.87–5.11)
RDW: 13.9 % (ref 11.5–15.5)
WBC: 5.9 10*3/uL (ref 4.0–10.5)

## 2023-02-10 LAB — TSH: TSH: 1.34 u[IU]/mL (ref 0.35–5.50)

## 2023-02-10 LAB — LIPID PANEL
Cholesterol: 200 mg/dL (ref 0–200)
HDL: 56.6 mg/dL (ref >39.00)
LDL Cholesterol: 128 mg/dL — ABNORMAL HIGH (ref 0–99)
NonHDL: 143.59
Total CHOL/HDL Ratio: 4
Triglycerides: 79 mg/dL (ref 0.0–149.0)
VLDL: 15.8 mg/dL (ref 0.0–40.0)

## 2023-02-10 LAB — VITAMIN D 25 HYDROXY (VIT D DEFICIENCY, FRACTURES): VITD: 27.8 ng/mL — ABNORMAL LOW (ref 30.00–100.00)

## 2023-02-10 LAB — VITAMIN B12: Vitamin B-12: 629 pg/mL (ref 211–911)

## 2023-02-10 MED ORDER — MONTELUKAST SODIUM 10 MG PO TABS
ORAL_TABLET | ORAL | 3 refills | Status: DC
Start: 2023-02-10 — End: 2023-12-09

## 2023-02-10 NOTE — Progress Notes (Signed)
Subjective:    Patient ID: Kathleen Conway, female    DOB: July 09, 1981, 42 y.o.   MRN: [id number]  Chief Complaint  Patient presents with   Annual Exam    Pt in office for annual CPE and fasting labs; pt still has lack of energy, anxiety and depression is about the same but now seems to be stalling. Pt requested to have Vitamin D and B-12 checked with labs due to lack of energy and hx of Vitamin D deficiency      HPI Patient is in today for annual exam.  Acute concerns: Noticing more tiredness again, would stay in bed all day on the weekends if she could.  Health maintenance: Lifestyle/ exercise: Trying to walk 30 minutes daily Nutrition: Balanced overall  Mental health: Zoloft 50 mg currently, more tired, staying in bed  Sleep: Waking several times through the night, sometimes night sweats, checking kids, etc; worse in the last month  Substance use: None ETOH: Rare Sexual activity: Monogamous, married  Colonoscopy: next year Pap: UTD Mammogram: UTD  Periods: Becoming heavier and more cramping, lasting longer than before    Past Medical History:  Diagnosis Date   Allergy    Anxiety    Anxiety and depression 08/15/2019   Asthma    Carpal tunnel syndrome during pregnancy    Depression    Phreesia 10/12/2020   GERD (gastroesophageal reflux disease)    History of Helicobacter pylori infection 08/15/2019   Hx of varicella    Migraines    Postpartum care following vaginal delivery (11/4) 07/26/2015   Vaginal Pap smear, abnormal    Vitamin D deficiency 08/15/2019    Past Surgical History:  Procedure Laterality Date   64 HOUR PH STUDY N/A 07/02/2019   Procedure: 24 HOUR PH STUDY;  Surgeon: Hilarie Fredrickson, MD;  Location: [facility];  Service: Endoscopy;  Laterality: N/A;   DILATION AND EVACUATION N/A 08/20/2014   Procedure: DILATATION AND EVACUATION;  Surgeon: Genia Del, MD;  Location: [facility];  Service: Gynecology;  Laterality: N/A;   ESOPHAGEAL MANOMETRY N/A  07/02/2019   Procedure: ESOPHAGEAL MANOMETRY (EM);  Surgeon: Hilarie Fredrickson, MD;  Location: [facility];  Service: Endoscopy;  Laterality: N/A;   PH IMPEDANCE STUDY N/A 07/02/2019   Procedure: PH IMPEDANCE STUDY;  Surgeon: Hilarie Fredrickson, MD;  Location: [facility];  Service: Endoscopy;  Laterality: N/A;   WISDOM TOOTH EXTRACTION      Family History  Problem Relation Age of Onset   Cancer Mother        breast   Cancer Father        prostate   Stroke Maternal Grandfather    Cancer Maternal Grandfather        colon- 46s   Cancer Paternal Grandfather    Stroke Maternal Grandmother    Heart attack Paternal Grandmother    Cancer Maternal Aunt        Breast and lung   Cancer Maternal Uncle        Lung   Down syndrome Paternal Aunt     Social History   Tobacco Use   Smoking status: Never   Smokeless tobacco: Never  Vaping Use   Vaping Use: Never used  Substance Use Topics   Alcohol use: Yes    Comment: socially-wine but none with pregnancy   Drug use: No     Allergies  Allergen Reactions   Topamax Nausea And Vomiting    Review of Systems NEGATIVE UNLESS OTHERWISE INDICATED  IN HPI      Objective:     BP 110/72 (BP Location: Left Arm)   Pulse 70   Temp (!) 97.3 F (36.3 C) (Temporal)   Ht 5\' 1"  (1.549 m)   Wt 153 lb 6.4 oz (69.6 kg)   LMP 01/16/2023 (Approximate)   SpO2 98%   Breastfeeding No   BMI 28.98 kg/m   Wt Readings from Last 3 Encounters:  02/10/23 153 lb 6.4 oz (69.6 kg)  06/28/22 150 lb 6.4 oz (68.2 kg)  03/18/22 153 lb (69.4 kg)    BP Readings from Last 3 Encounters:  02/10/23 110/72  06/28/22 118/70  02/05/22 137/85     Physical Exam Vitals and nursing note reviewed.  Constitutional:      Appearance: Normal appearance. She is normal weight. She is not toxic-appearing.  HENT:     Head: Normocephalic and atraumatic.     Right Ear: Tympanic membrane, ear canal and external ear normal.     Left Ear: Tympanic membrane, ear canal and  external ear normal.     Nose: Nose normal.     Mouth/Throat:     Mouth: Mucous membranes are moist.  Eyes:     Extraocular Movements: Extraocular movements intact.     Conjunctiva/sclera: Conjunctivae normal.     Pupils: Pupils are equal, round, and reactive to light.  Cardiovascular:     Rate and Rhythm: Normal rate and regular rhythm.     Pulses: Normal pulses.     Heart sounds: Normal heart sounds.  Pulmonary:     Effort: Pulmonary effort is normal.     Breath sounds: Normal breath sounds.  Abdominal:     General: Abdomen is flat. Bowel sounds are normal.     Palpations: Abdomen is soft.  Musculoskeletal:        General: Normal range of motion.     Cervical back: Normal range of motion and neck supple.  Skin:    General: Skin is warm and dry.  Neurological:     General: No focal deficit present.     Mental Status: She is alert and oriented to person, place, and time.  Psychiatric:        Mood and Affect: Mood normal.        Behavior: Behavior normal.        Thought Content: Thought content normal.        Judgment: Judgment normal.        Assessment & Plan:  Encounter for annual physical exam -     CBC with Differential/Platelet -     Comprehensive metabolic panel -     Lipid panel -     TSH -     Vitamin B12 -     VITAMIN D 25 Hydroxy (Vit-D Deficiency, Fractures)  Other fatigue -     TSH -     Vitamin B12 -     VITAMIN D 25 Hydroxy (Vit-D Deficiency, Fractures)  Vitamin D deficiency -     VITAMIN D 25 Hydroxy (Vit-D Deficiency, Fractures)  Seasonal allergies -     Montelukast Sodium; TAKE 1 TABLET(10 MG) BY MOUTH AT BEDTIME  Dispense: 90 tablet; Refill: 3  Anxiety and depression Assessment & Plan: Currently on Zoloft 50 mg daily Check labs today, pending normal labs and no underling cause for tiredness, will try to increase Zoloft to 100 mg daily. Consider adding Wellbutrin. Discussed with pt likely peri-menopause / hormonal changes contributing.      Age-appropriate  screening and counseling performed today. Will check labs and call with results. Preventive measures discussed and printed in AVS for patient.   Patient Counseling: [x]   Nutrition: Stressed importance of moderation in sodium/caffeine intake, saturated fat and cholesterol, caloric balance, sufficient intake of fresh fruits, vegetables, and fiber.  [x]   Stressed the importance of regular exercise.   [x]   Substance Abuse: Discussed cessation/primary prevention of tobacco, alcohol, or other drug use; driving or other dangerous activities under the influence; availability of treatment for abuse.   []   Injury prevention: Discussed safety belts, safety helmets, smoke detector, smoking near bedding or upholstery.   []   Sexuality: Discussed sexually transmitted diseases, partner selection, use of condoms, avoidance of unintended pregnancy  and contraceptive alternatives.   [x]   Dental health: Discussed importance of regular tooth brushing, flossing, and dental visits.  [x]   Health maintenance and immunizations reviewed. Please refer to Health maintenance section.         Return in about 1 year (around 02/10/2024) for physical, fasting labs .     Zella Dewan M Shante Maysonet, PA-C

## 2023-02-10 NOTE — Assessment & Plan Note (Signed)
Currently on Zoloft 50 mg daily Check labs today, pending normal labs and no underling cause for tiredness, will try to increase Zoloft to 100 mg daily. Consider adding Wellbutrin. Discussed with pt likely peri-menopause / hormonal changes contributing.

## 2023-02-10 NOTE — Patient Instructions (Signed)
Labs today Singulair refilled Consider increasing Zoloft to 100 mg dosing  Keep up good work!

## 2023-02-11 ENCOUNTER — Encounter: Payer: Self-pay | Admitting: Physician Assistant

## 2023-02-14 ENCOUNTER — Other Ambulatory Visit (HOSPITAL_COMMUNITY): Payer: Self-pay | Admitting: Obstetrics & Gynecology

## 2023-02-14 DIAGNOSIS — Z9189 Other specified personal risk factors, not elsewhere classified: Secondary | ICD-10-CM

## 2023-02-14 DIAGNOSIS — Z803 Family history of malignant neoplasm of breast: Secondary | ICD-10-CM

## 2023-03-25 ENCOUNTER — Other Ambulatory Visit: Payer: Self-pay | Admitting: Physician Assistant

## 2023-03-25 ENCOUNTER — Encounter: Payer: Self-pay | Admitting: Physician Assistant

## 2023-03-25 ENCOUNTER — Other Ambulatory Visit: Payer: Self-pay

## 2023-03-25 MED ORDER — SERTRALINE HCL 100 MG PO TABS: 100.0000 mg | ORAL_TABLET | Freq: Every day | ORAL | 3 refills | Status: DC

## 2023-03-25 NOTE — Telephone Encounter (Signed)
Per last OV note waiting on labs to increase to 100mg  Zoloft. Labs were normal, ok to increase dose to 100mg ?

## 2023-03-25 NOTE — Telephone Encounter (Signed)
Spoke with patient and pt not ran out just going out of town and not wanting to miss any doses. New Rx for 100mg  sent to pt pharmacy for pickup. Pt verbalized understanding

## 2023-07-02 ENCOUNTER — Other Ambulatory Visit: Payer: Self-pay | Admitting: Allergy

## 2023-10-13 ENCOUNTER — Telehealth: Payer: 59 | Admitting: Physician Assistant

## 2023-10-13 DIAGNOSIS — L239 Allergic contact dermatitis, unspecified cause: Secondary | ICD-10-CM | POA: Diagnosis not present

## 2023-10-13 MED ORDER — PREDNISONE 10 MG PO TABS
ORAL_TABLET | ORAL | 0 refills | Status: AC
Start: 2023-10-13 — End: 2023-10-27

## 2023-10-13 NOTE — Progress Notes (Signed)
E Visit for Rash  We are sorry that you are not feeling well. Here is how we plan to help!  Based on what you shared with me it looks like you have an allergic dermatitis.  This does not seem like a medication reaction rash, but if not resolving you need to follow-up with your PCP. Your skin may be red, swollen, dry, cracked, and itch.  The rash should go away in a few days but can last a few weeks.  If you get a rash, it's important to figure out what caused it so the irritant can be avoided in the future. and I am prescribing a two week course of steroids (37 tablets of 10 mg prednisone).  Days 1-4 take 4 tablets (40 mg) daily  Days 5-8 take 3 tablets (30 mg) daily, Days 9-11 take 2 tablets (20 mg) daily, Days 12-14 take 1 tablet (10 mg) daily.      HOME CARE:  Take cool showers and avoid direct sunlight. Apply cool compress or wet dressings. Take a bath in an oatmeal bath.  Sprinkle content of one Aveeno packet under running faucet with comfortably warm water.  Bathe for 15-20 minutes, 1-2 times daily.  Pat dry with a towel. Do not rub the rash. Use hydrocortisone cream. Take an antihistamine like Benadryl for widespread rashes that itch.  The adult dose of Benadryl is 25-50 mg by mouth 4 times daily. Caution:  This type of medication may cause sleepiness.  Do not drink alcohol, drive, or operate dangerous machinery while taking antihistamines.  Do not take these medications if you have prostate enlargement.  Read package instructions thoroughly on all medications that you take.  GET HELP RIGHT AWAY IF:  Symptoms don't go away after treatment. Severe itching that persists. If you rash spreads or swells. If you rash begins to smell. If it blisters and opens or develops a yellow-brown crust. You develop a fever. You have a sore throat. You become short of breath.  MAKE SURE YOU:  Understand these instructions. Will watch your condition. Will get help right away if you are not doing  well or get worse.  Thank you for choosing an e-visit.  Your e-visit answers were reviewed by a board certified advanced clinical practitioner to complete your personal care plan. Depending upon the condition, your plan could have included both over the counter or prescription medications.  Please review your pharmacy choice. Make sure the pharmacy is open so you can pick up prescription now. If there is a problem, you may contact your provider through [employer] and have the prescription routed to another pharmacy.  Your safety is important to [place]. If you have drug allergies check your prescription carefully.   For the next 24 hours you can use MyChart to ask questions about today's visit, request a non-urgent call back, or ask for a work or school excuse. You will get an email in the next two days asking about your experience. I hope that your e-visit has been valuable and will speed your recovery.

## 2023-10-13 NOTE — Progress Notes (Signed)
I have spent 5 minutes in review of e-visit questionnaire, review and updating patient chart, medical decision making and response to patient.   Mia Milan Cody Jacklynn Dehaas, PA-C    

## 2023-11-10 ENCOUNTER — Ambulatory Visit: Payer: 59 | Admitting: Physician Assistant

## 2023-11-17 ENCOUNTER — Ambulatory Visit: Payer: 59

## 2023-11-17 ENCOUNTER — Ambulatory Visit: Payer: 59 | Admitting: Physician Assistant

## 2023-11-17 ENCOUNTER — Encounter: Payer: Self-pay | Admitting: Physician Assistant

## 2023-11-17 VITALS — BP 138/86 | HR 86 | Temp 98.4°F | Ht 61.0 in | Wt 162.4 lb

## 2023-11-17 DIAGNOSIS — R7989 Other specified abnormal findings of blood chemistry: Secondary | ICD-10-CM

## 2023-11-17 DIAGNOSIS — F32A Depression, unspecified: Secondary | ICD-10-CM

## 2023-11-17 DIAGNOSIS — J4530 Mild persistent asthma, uncomplicated: Secondary | ICD-10-CM | POA: Diagnosis not present

## 2023-11-17 DIAGNOSIS — R053 Chronic cough: Secondary | ICD-10-CM

## 2023-11-17 DIAGNOSIS — F419 Anxiety disorder, unspecified: Secondary | ICD-10-CM | POA: Diagnosis not present

## 2023-11-17 LAB — T4, FREE: Free T4: 0.65 ng/dL (ref 0.60–1.60)

## 2023-11-17 LAB — TSH: TSH: 1.7 u[IU]/mL (ref 0.35–5.50)

## 2023-11-17 LAB — T3, FREE: T3, Free: 3.6 pg/mL (ref 2.3–4.2)

## 2023-11-17 MED ORDER — AIRSUPRA 90-80 MCG/ACT IN AERO
2.0000 | INHALATION_SPRAY | Freq: Four times a day (QID) | RESPIRATORY_TRACT | 2 refills | Status: DC | PRN
Start: 2023-11-17 — End: 2023-12-09

## 2023-11-17 MED ORDER — BUDESONIDE-FORMOTEROL FUMARATE 80-4.5 MCG/ACT IN AERO
2.0000 | INHALATION_SPRAY | Freq: Two times a day (BID) | RESPIRATORY_TRACT | 12 refills | Status: DC
Start: 2023-11-17 — End: 2024-01-04

## 2023-11-17 NOTE — Patient Instructions (Signed)
 VISIT SUMMARY:  Kathleen Conway, a 43 year old female, visited today due to a persistent cough lasting three months. She has a history of chronic cough since 2019, which worsens with weather changes and certain environments. She also has a history of allergic rhinitis and postnasal drip. Additionally, she has experienced extreme fatigue and weight gain, with a recent low TSH level, and is currently being treated for depression.  YOUR PLAN:  -CHRONIC COUGH: Chronic cough is a cough that lasts for an extended period, often due to underlying conditions like allergies or asthma. We will order a chest x-ray to check your lung condition and may refer you to a lung specialist if the cough continues.  -POSSIBLE HYPERTHYROIDISM: Hyperthyroidism is when the thyroid gland is overactive, which can cause symptoms like fatigue and weight gain. We will repeat your TSH test and add a TRAB test to check for Graves' disease. If your thyroid levels are still abnormal, we will refer you to an endocrinologist.  -DEPRESSION: Depression is a mental health condition that affects mood and overall well-being. You are currently being treated with Wellbutrin, and we will follow up in six months to monitor your symptoms and possibly manage your prescription.  INSTRUCTIONS:  1. Get a chest x-ray as ordered to evaluate your lung condition. 2. Repeat the TSH test and add a TRAB test to check for possible Graves' disease. 3. Follow up in six months to monitor your depression symptoms and manage your Wellbutrin prescription if needed.

## 2023-11-17 NOTE — Progress Notes (Signed)
 Patient ID: Kathleen Conway, female    DOB: [birth year], 43 y.o.   MRN: [id number]   Assessment & Plan:  Chronic cough -     Budesonide-Formoterol Fumarate; Inhale 2 puffs into the lungs 2 (two) times daily. Rinse mouth after use.  Dispense: 1 each; Refill: 12 -     Airsupra; Inhale 2 puffs into the lungs 4 (four) times daily as needed.  Dispense: 10.7 g; Refill: 2 -     DG Chest 2 View; Future  Mild persistent asthma without complication -     Budesonide-Formoterol Fumarate; Inhale 2 puffs into the lungs 2 (two) times daily. Rinse mouth after use.  Dispense: 1 each; Refill: 12 -     Airsupra; Inhale 2 puffs into the lungs 4 (four) times daily as needed.  Dispense: 10.7 g; Refill: 2 -     DG Chest 2 View; Future  Low TSH level -     TSH -     T4, free -     T3, free -     TRAb (TSH Receptor Binding Antibody)  Anxiety and depression    Assessment and Plan    Chronic Cough Persistent cough for three months, previously improved with Singulair and Zyrtec. No associated illness or clear triggers. Currently on Singulair, Zyrtec, and Ryaltris nasal spray. -Order chest x-ray to evaluate lung condition. -Change Proair to Airsupra for rescue; Add Symbicort as directed -Call Dr. Selena Batten to schedule f/up   Possible Hyperthyroidism Previous TSH level of 0.406, with symptoms of extreme fatigue and weight gain. No current symptoms of hyperthyroidism. -Repeat TSH level and add TRAB test to evaluate for possible Graves' disease. -If thyroid levels remain abnormal, refer to endocrinology for further management.  Depression Currently managed by OB/GYN with Wellbutrin, with improvement in symptoms. Recent personal losses noted. -Plan for 21-month follow-up to monitor depression symptoms and potentially take over management of Wellbutrin prescription.          Return in about 6 months (around 05/16/2024) for recheck/follow-up.    Subjective:    Chief Complaint  Patient presents with    Cough    Pt in the office for Chronic cough and discuss recent lab results; pt states cough has been back for past 3 months;     HPI Discussed the use of AI scribe software for clinical note transcription with the patient, who gave verbal consent to proceed.  History of Present Illness   Kathleen Conway is a 43 year old female with a history of chronic cough who presents with persistent cough for three months.  She has experienced a persistent cough for the past three months, which tends to recur with changes in weather. The cough has been a recurring issue since 2019 and is severe enough to induce vomiting and chest pain, although these symptoms have improved recently. There is no recent illness or cold symptoms, and nasal discharge is clear. Previous evaluations include a normal chest x-ray, negative reflux workup, and treatment for H. pylori. Spirometry showed some airway restriction, and she uses albuterol as a rescue inhaler every four to six hours. She has not been diagnosed with asthma but has symptoms suggestive of it, such as shortness of breath and difficulty with exertion.  She has a history of allergic rhinitis and postnasal drip, treated with montelukast, Zyrtec, and Ryaltris nasal spray. She restarted these medications over a month ago after a period of discontinuation. A two-week course of prednisone for an allergic reaction provided  some relief but did not resolve the cough completely.  There are no new environmental changes, pets, or living arrangements that could explain her symptoms. She notices that her cough worsens in certain environments, such as when she goes outside or gets into her car.  In addition to her respiratory symptoms, she mentions a history of low vitamin D and a recent TSH level of 0.406, checked due to symptoms of extreme fatigue and weight gain. She has been feeling better since starting depression medication, although she experienced significant fatigue  and sleepiness prior to this.       Past Medical History:  Diagnosis Date   Allergy    Anxiety    Anxiety and depression 08/15/2019   Asthma    Carpal tunnel syndrome during pregnancy    Depression    Phreesia 10/12/2020   GERD (gastroesophageal reflux disease)    History of Helicobacter pylori infection 08/15/2019   Hx of varicella    Migraines    Postpartum care following vaginal delivery (11/4) 07/26/2015   Vaginal Pap smear, abnormal    Vitamin D deficiency 08/15/2019    Past Surgical History:  Procedure Laterality Date   24 HOUR PH STUDY N/A 07/02/2019   Procedure: 24 HOUR PH STUDY;  Surgeon: Hilarie Fredrickson, MD;  Location: [facility];  Service: Endoscopy;  Laterality: N/A;   DILATION AND EVACUATION N/A 08/20/2014   Procedure: DILATATION AND EVACUATION;  Surgeon: Genia Del, MD;  Location: [facility];  Service: Gynecology;  Laterality: N/A;   ESOPHAGEAL MANOMETRY N/A 07/02/2019   Procedure: ESOPHAGEAL MANOMETRY (EM);  Surgeon: Hilarie Fredrickson, MD;  Location: [facility];  Service: Endoscopy;  Laterality: N/A;   PH IMPEDANCE STUDY N/A 07/02/2019   Procedure: PH IMPEDANCE STUDY;  Surgeon: Hilarie Fredrickson, MD;  Location: [facility];  Service: Endoscopy;  Laterality: N/A;   WISDOM TOOTH EXTRACTION      Family History  Problem Relation Age of Onset   Cancer Mother 18       breast   Cancer Father 62       prostate   Stroke Maternal Grandfather    Cancer Maternal Grandfather        colon- 53s   Cancer Paternal Grandfather    Stroke Maternal Grandmother    Heart attack Paternal Grandmother    Cancer Maternal Aunt        Breast and lung   Cancer Maternal Uncle        Lung   Down syndrome Paternal Aunt     Social History   Tobacco Use   Smoking status: Never   Smokeless tobacco: Never  Vaping Use   Vaping status: Never Used  Substance Use Topics   Alcohol use: Yes    Comment: socially-wine but none with pregnancy   Drug use: No     Allergies  Allergen  Reactions   Topamax Nausea And Vomiting    Review of Systems NEGATIVE UNLESS OTHERWISE INDICATED IN HPI      Objective:     BP 138/86 (BP Location: Left Arm, Patient Position: Sitting, Cuff Size: Normal)   Pulse 86   Temp 98.4 F (36.9 C) (Temporal)   Ht 5\' 1"  (1.549 m)   Wt 162 lb 6.4 oz (73.7 kg)   LMP 10/26/2023 (Approximate)   SpO2 97%   BMI 30.69 kg/m   Wt Readings from Last 3 Encounters:  11/17/23 162 lb 6.4 oz (73.7 kg)  02/10/23 153 lb 6.4 oz (69.6 kg)  06/28/22  150 lb 6.4 oz (68.2 kg)    BP Readings from Last 3 Encounters:  11/17/23 138/86  02/10/23 110/72  06/28/22 118/70     Physical Exam Vitals and nursing note reviewed.  Constitutional:      General: She is not in acute distress.    Appearance: Normal appearance. She is not ill-appearing.  HENT:     Head: Normocephalic.     Right Ear: Tympanic membrane, ear canal and external ear normal.     Left Ear: Tympanic membrane, ear canal and external ear normal.     Nose: No congestion.     Mouth/Throat:     Mouth: Mucous membranes are moist.     Pharynx: No oropharyngeal exudate or posterior oropharyngeal erythema.  Eyes:     Extraocular Movements: Extraocular movements intact.     Conjunctiva/sclera: Conjunctivae normal.     Pupils: Pupils are equal, round, and reactive to light.  Cardiovascular:     Rate and Rhythm: Normal rate and regular rhythm.     Pulses: Normal pulses.     Heart sounds: Normal heart sounds. No murmur heard. Pulmonary:     Effort: Pulmonary effort is normal. No respiratory distress.     Breath sounds: Normal breath sounds. No wheezing.     Comments: Hacking cough intermittent  Musculoskeletal:     Cervical back: Normal range of motion.  Skin:    General: Skin is warm.     Findings: No lesion or rash.  Neurological:     Mental Status: She is alert and oriented to person, place, and time.  Psychiatric:        Mood and Affect: Mood normal.        Behavior: Behavior normal.         Keondra Haydu M Matilynn Dacey, PA-C

## 2023-11-18 ENCOUNTER — Encounter: Payer: Self-pay | Admitting: Physician Assistant

## 2023-11-20 LAB — TRAB (TSH RECEPTOR BINDING ANTIBODY): TRAB: <1 IU/L (ref <=2.00)

## 2023-11-28 ENCOUNTER — Encounter: Payer: Self-pay | Admitting: Physician Assistant

## 2023-12-09 ENCOUNTER — Ambulatory Visit: Admitting: Internal Medicine

## 2023-12-09 ENCOUNTER — Other Ambulatory Visit: Payer: Self-pay

## 2023-12-09 ENCOUNTER — Encounter: Payer: Self-pay | Admitting: Internal Medicine

## 2023-12-09 VITALS — BP 128/88 | HR 90 | Temp 98.3°F | Resp 18 | Ht 61.0 in | Wt 161.5 lb

## 2023-12-09 DIAGNOSIS — J3089 Other allergic rhinitis: Secondary | ICD-10-CM | POA: Diagnosis not present

## 2023-12-09 DIAGNOSIS — J45991 Cough variant asthma: Secondary | ICD-10-CM | POA: Diagnosis not present

## 2023-12-09 MED ORDER — ALBUTEROL SULFATE HFA 108 (90 BASE) MCG/ACT IN AERS: 2.0000 | INHALATION_SPRAY | Freq: Four times a day (QID) | RESPIRATORY_TRACT | 1 refills | Status: DC | PRN

## 2023-12-09 MED ORDER — CETIRIZINE HCL 10 MG PO TABS: 10.0000 mg | ORAL_TABLET | Freq: Every day | ORAL | 1 refills | Status: DC

## 2023-12-09 MED ORDER — MONTELUKAST SODIUM 10 MG PO TABS
10.0000 mg | ORAL_TABLET | Freq: Every day | ORAL | 1 refills | Status: DC
Start: 2023-12-09 — End: 2024-01-04

## 2023-12-09 MED ORDER — BREZTRI AEROSPHERE 160-9-4.8 MCG/ACT IN AERO: 2.0000 | INHALATION_SPRAY | Freq: Two times a day (BID) | RESPIRATORY_TRACT | 5 refills | Status: DC

## 2023-12-09 MED ORDER — FLUTICASONE PROPIONATE 50 MCG/ACT NA SUSP: 2.0000 | Freq: Every day | NASAL | 5 refills | Status: DC

## 2023-12-09 NOTE — Progress Notes (Signed)
 FOLLOW UP Date of Service/Encounter:  12/09/23   Subjective:  Kathleen Conway (DOB: 12/26/1980) is a 43 y.o. female who [facility] for follow up for allergic rhinitis and chronic cough.   History obtained from: chart review and patient. Last seen by Dr Selena Batten 06/28/2022 for allergic rhinitis and chronic cough.  Previously with normal spirometry.  On Singulair and PRN albuterol.  Singulair did help with cough.   In the last 6 months, has noted return of a dry cough. Some chest discomfort also from coughing so much. Randomly throughout the day.  Worse when walking to car or eating.  Previously had stopped taking all medications since her cough had resolved.  In January, restarted Zyrtec and Singulair without. Seen by PCP in February, started on Symbicort which she is taking 2 puffs BID with minimal relief.  Does tend to clear her throat frequently. Also has some trouble with on and off congestion, drainage, runny nose.  Use Flonase, Singulair, Zyrtec daily. She works in [employer], site so does have to talk a lot at work.  Never seen ENT.   Has a hx of GERD but in the past EGD and manometry were normal so GI did not think the cough was related to reflux.  She denies much heartburn/sour taste currently.    Past Medical History: Past Medical History:  Diagnosis Date   Allergy    Anxiety    Anxiety and depression 08/15/2019   Asthma    Carpal tunnel syndrome during pregnancy    Depression    Phreesia 10/12/2020   GERD (gastroesophageal reflux disease)    History of Helicobacter pylori infection 08/15/2019   Hx of varicella    Migraines    Postpartum care following vaginal delivery (11/4) 07/26/2015   Vaginal Pap smear, abnormal    Vitamin D deficiency 08/15/2019    Objective:  BP 128/88 (BP Location: Right Arm, Patient Position: Sitting, Cuff Size: Normal)   Pulse 90   Temp 98.3 F (36.8 C) (Temporal)   Resp 18   Ht 5\' 1"  (1.549 m)    Wt 161 lb 8 oz (73.3 kg)   LMP 10/26/2023 (Approximate)   SpO2 99%   BMI 30.52 kg/m  Body mass index is 30.52 kg/m. Physical Exam: GEN: alert, well developed HEENT: clear conjunctiva, nose with mild inferior turbinate hypertrophy, pink nasal mucosa, + clear rhinorrhea, + cobblestoning HEART: regular rate and rhythm, no murmur LUNGS: clear to auscultation bilaterally, no coughing, unlabored respiration SKIN: no rashes or lesions  Spirometry:  Tracings reviewed. Her effort: It was hard to get consistent efforts and there is a question as to whether this reflects a maximal maneuver. FVC: 2.11L, 77% predicted; post 2.08, 76% FEV1: 1.45L, 64% predicted; post 1.5, 67% FEV1/FVC ratio: 69% Interpretation: Spirometry consistent with mild obstructive disease. No post bronchodilator reversibility noted.  Please see scanned spirometry results for details.  Assessment:   1. Perennial allergic rhinitis   2. Cough variant asthma     Plan/Recommendations:  Cough Variant Asthma:  - MDI technique discussed.  Spirometry today with mild obstruction, no reversibility.  Will escalate therapy to triple inhaler due to frequent cough. Will keep LPR in differential and consider ENT evaluation. - Avoid frequent throat clearing.  Take small sips of water or use a peppermint/wintergreen altoid.  - Maintenance inhaler: stop Symbicort. Start Breztri 160-9-4.86mcg 2 puffs twice daily.  Continue Singulair 10mg  daily.  - Rescue inhaler: Albuterol 2 puffs via spacer  or 1 vial via nebulizer every 4-6 hours as needed for respiratory symptoms of cough, shortness of breath, or wheezing Asthma control goals:  Full participation in all desired activities (may need albuterol before activity) Albuterol use two times or less a week on average (not counting use with activity) Cough interfering with sleep two times or less a month Oral steroids no more than once a year No hospitalizations    Allergic Rhinitis: -  Uncontrolled, discussed PND can lead to cough also.   - Positive skin test 3/20201: dust mites and cockroach - Use nasal saline rinses before nose sprays such as with Neilmed Sinus Rinse.  Use distilled water.   - Use Flonase 2 sprays each nostril daily. Aim upward and outward. - Use Azelastine 2 sprays each nostril twice daily. Aim upward and outward. - Use Zyrtec 10 mg daily.  - Use Singulair 10mg  daily. Stop if there are any mood/behavioral changes. - Consider allergy shots as long term control of your symptoms by teaching your immune system to be more tolerant of your allergy triggers     Return in about 4 weeks (around 01/06/2024).  Alesia Morin, MD [facility]

## 2023-12-09 NOTE — Patient Instructions (Addendum)
 Cough Variant Asthma:  - Avoid frequent throat clearing.  Take small sips of water or use a peppermint/wintergreen altoid.  - Maintenance inhaler: stop Symbicort. Start Breztri 160-9-4.66mcg 2 puffs twice daily.  Continue Singulair 10mg  daily.  - Rescue inhaler: Albuterol 2 puffs or 1 vial via nebulizer every 4-6 hours as needed for respiratory symptoms of  shortness of breath, or wheezing Asthma control goals:  Full participation in all desired activities (may need albuterol before activity) Albuterol use two times or less a week on average (not counting use with activity) Cough interfering with sleep two times or less a month Oral steroids no more than once a year No hospitalizations    Allergic Rhinitis: - Positive skin test 3/20201: dust mites and cockroach - Use nasal saline rinses before nose sprays such as with Neilmed Sinus Rinse.  Use distilled water.   - Use Flonase 2 sprays each nostril daily. Aim upward and outward. - Use Azelastine 2 sprays each nostril twice daily. Aim upward and outward. - Use Zyrtec 10 mg daily.  - Use Singulair 10mg  daily. Stop if there are any mood/behavioral changes. - Consider allergy shots as long term control of your symptoms by teaching your immune system to be more tolerant of your allergy triggers

## 2023-12-28 LAB — HM MAMMOGRAPHY

## 2024-01-04 ENCOUNTER — Ambulatory Visit: Admitting: Internal Medicine

## 2024-01-04 ENCOUNTER — Other Ambulatory Visit: Payer: Self-pay

## 2024-01-04 VITALS — BP 138/82 | HR 92 | Temp 98.7°F | Resp 12

## 2024-01-04 DIAGNOSIS — J45991 Cough variant asthma: Secondary | ICD-10-CM

## 2024-01-04 DIAGNOSIS — J3089 Other allergic rhinitis: Secondary | ICD-10-CM

## 2024-01-04 MED ORDER — FLUTICASONE PROPIONATE 50 MCG/ACT NA SUSP: 2.0000 | Freq: Every day | NASAL | 5 refills | Status: DC

## 2024-01-04 MED ORDER — MONTELUKAST SODIUM 10 MG PO TABS: 10.0000 mg | ORAL_TABLET | Freq: Every day | ORAL | 1 refills | Status: DC

## 2024-01-04 MED ORDER — AZELASTINE HCL 0.1 % NA SOLN: 2.0000 | Freq: Two times a day (BID) | NASAL | 5 refills | Status: DC | PRN

## 2024-01-04 MED ORDER — ALBUTEROL SULFATE HFA 108 (90 BASE) MCG/ACT IN AERS: 2.0000 | INHALATION_SPRAY | Freq: Four times a day (QID) | RESPIRATORY_TRACT | 1 refills | Status: DC | PRN

## 2024-01-04 MED ORDER — BREZTRI AEROSPHERE 160-9-4.8 MCG/ACT IN AERO: 2.0000 | INHALATION_SPRAY | Freq: Two times a day (BID) | RESPIRATORY_TRACT | 5 refills | Status: DC

## 2024-01-04 MED ORDER — CETIRIZINE HCL 10 MG PO TABS: 10.0000 mg | ORAL_TABLET | Freq: Every day | ORAL | 1 refills | Status: DC

## 2024-01-04 NOTE — Addendum Note (Signed)
 Addended by: Norville Beery, Caidin Heidenreich on: 01/04/2024 05:06 PM   Modules accepted: Orders

## 2024-01-04 NOTE — Progress Notes (Signed)
 FOLLOW UP Date of Service/Encounter:  01/04/24   Subjective:  Kathleen Conway (DOB: December 20, 1980) is a 43 y.o. female who [facility] for follow up for cough variant asthma and allergic rhinitis.   History obtained from: chart review and patient. Last visit was with me on 12/09/2023 and at the time, was having trouble with dry cough, restarted Symbicort by PCP but still persistent; discussed uptitration to [employer].  Also restarted allergy regimen to help with post nasal drip.  Reports cough is doing a lot better since last visit.  Still sometimes has it with weather change but no longer daily. No dyspnea/wheezing.  No chest tightness.  Rarely needs Albuterol.  Taking Breztri and Singulair.  Allergies are doing better too with not as much congestion/drainage.  Takes Zyrtec and Singulair daily but forgets the nose sprays.   Past Medical History: Past Medical History:  Diagnosis Date   Allergy    Anxiety    Anxiety and depression 08/15/2019   Asthma    Carpal tunnel syndrome during pregnancy    Depression    Phreesia 10/12/2020   GERD (gastroesophageal reflux disease)    History of Helicobacter pylori infection 08/15/2019   Hx of varicella    Migraines    Postpartum care following vaginal delivery (11/4) 07/26/2015   Vaginal Pap smear, abnormal    Vitamin D deficiency 08/15/2019    Objective:  BP 138/82   Pulse 92   Temp 98.7 F (37.1 C)   Resp 12   SpO2 98%  There is no height or weight on file to calculate BMI. Physical Exam: GEN: alert, well developed HEENT: clear conjunctiva, nose with mild inferior turbinate hypertrophy, pink nasal mucosa, slight clear rhinorrhea, no cobblestoning HEART: regular rate and rhythm, no murmur LUNGS: clear to auscultation bilaterally, no coughing, unlabored respiration SKIN: no rashes or lesions  Spirometry:  Tracings reviewed. Her effort: Good reproducible efforts. FVC: 2.19L, 79%  predicted  FEV1: 1.83 L, 81% predicted FEV1/FVC ratio: 84% Interpretation: Spirometry consistent with normal pattern.  Please see scanned spirometry results for details.  Assessment:   1. Perennial allergic rhinitis   2. Cough variant asthma     Plan/Recommendations:  Cough Variant Asthma:  - Improved with Markus Daft will continue for now.  Spirometry today is normal. MDI technique discussed.  - Maintenance inhaler: continue Breztri 160-9-4.37mcg 2 puffs twice daily.  Continue Singulair 10mg  daily.  - Rescue inhaler: Albuterol 2 puffs or 1 vial via nebulizer every 4-6 hours as needed for respiratory symptoms of  shortness of breath, or wheezing Asthma control goals:  Full participation in all desired activities (may need albuterol before activity) Albuterol use two times or less a week on average (not counting use with activity) Cough interfering with sleep two times or less a month Oral steroids no more than once a year No hospitalizations   Allergic Rhinitis: - Improved - Positive skin test 3/20201: dust mites and cockroach - Use nasal saline rinses before nose sprays such as with Neilmed Sinus Rinse.  Use distilled water.   - Use Flonase 2 sprays each nostril daily. Aim upward and outward. - Use Azelastine 2 sprays each nostril twice daily as needed for congestion/drainage/runny nose. Aim upward and outward. - Use Zyrtec 10 mg daily.  - Use Singulair 10mg  daily. Stop if there are any mood/behavioral changes. - Consider allergy shots as long term control of your symptoms by teaching your immune system to be more tolerant of  your allergy triggers    Return in about 4 months (around 05/05/2024).  Kristen Petri, MD [facility] 

## 2024-01-04 NOTE — Patient Instructions (Addendum)
 Cough Variant Asthma:  - Maintenance inhaler: continue Breztri 160-9-4.8mcg 2 puffs twice daily.  Continue Singulair 10mg  daily.  - Rescue inhaler: Albuterol 2 puffs or 1 vial via nebulizer every 4-6 hours as needed for respiratory symptoms of  shortness of breath, or wheezing Asthma control goals:  Full participation in all desired activities (may need albuterol before activity) Albuterol use two times or less a week on average (not counting use with activity) Cough interfering with sleep two times or less a month Oral steroids no more than once a year No hospitalizations   Allergic Rhinitis: - Positive skin test 3/20201: dust mites and cockroach - Use nasal saline rinses before nose sprays such as with Neilmed Sinus Rinse.  Use distilled water.   - Use Flonase 2 sprays each nostril daily. Aim upward and outward. - Use Azelastine 2 sprays each nostril twice daily as needed for congestion/drainage/runny nose. Aim upward and outward. - Use Zyrtec 10 mg daily.  - Use Singulair 10mg  daily. Stop if there are any mood/behavioral changes. - Consider allergy shots as long term control of your symptoms by teaching your immune system to be more tolerant of your allergy triggers

## 2024-01-06 LAB — HM COLONOSCOPY

## 2024-04-12 ENCOUNTER — Ambulatory Visit: Admitting: Physician Assistant

## 2024-04-12 ENCOUNTER — Encounter: Admitting: Physician Assistant

## 2024-04-12 VITALS — BP 142/110 | HR 79 | Temp 98.1°F | Ht 61.0 in | Wt 168.4 lb

## 2024-04-12 DIAGNOSIS — B353 Tinea pedis: Secondary | ICD-10-CM

## 2024-04-12 DIAGNOSIS — R5383 Other fatigue: Secondary | ICD-10-CM | POA: Diagnosis not present

## 2024-04-12 DIAGNOSIS — I10 Essential (primary) hypertension: Secondary | ICD-10-CM

## 2024-04-12 DIAGNOSIS — Z23 Encounter for immunization: Secondary | ICD-10-CM

## 2024-04-12 DIAGNOSIS — E7800 Pure hypercholesterolemia, unspecified: Secondary | ICD-10-CM | POA: Diagnosis not present

## 2024-04-12 DIAGNOSIS — N951 Menopausal and female climacteric states: Secondary | ICD-10-CM

## 2024-04-12 DIAGNOSIS — R635 Abnormal weight gain: Secondary | ICD-10-CM

## 2024-04-12 LAB — LIPID PANEL
Cholesterol: 215 mg/dL — ABNORMAL HIGH (ref 0–200)
HDL: 54.9 mg/dL (ref >39.00)
LDL Cholesterol: 146 mg/dL — ABNORMAL HIGH (ref 0–99)
NonHDL: 160.19
Total CHOL/HDL Ratio: 4
Triglycerides: 72 mg/dL (ref 0.0–149.0)
VLDL: 14.4 mg/dL (ref 0.0–40.0)

## 2024-04-12 LAB — CBC WITH DIFFERENTIAL/PLATELET
Basophils Absolute: 0 K/uL (ref 0.0–0.1)
Basophils Relative: 0.9 % (ref 0.0–3.0)
Eosinophils Absolute: 0.1 K/uL (ref 0.0–0.7)
Eosinophils Relative: 1.6 % (ref 0.0–5.0)
HCT: 39.5 % (ref 36.0–46.0)
Hemoglobin: 13.1 g/dL (ref 12.0–15.0)
Lymphocytes Relative: 29.1 % (ref 12.0–46.0)
Lymphs Abs: 1.6 K/uL (ref 0.7–4.0)
MCHC: 33.2 g/dL (ref 30.0–36.0)
MCV: 90.4 fl (ref 78.0–100.0)
Monocytes Absolute: 0.4 K/uL (ref 0.1–1.0)
Monocytes Relative: 8.2 % (ref 3.0–12.0)
Neutro Abs: 3.3 K/uL (ref 1.4–7.7)
Neutrophils Relative %: 60.2 % (ref 43.0–77.0)
Platelets: 334 K/uL (ref 150.0–400.0)
RBC: 4.37 Mil/uL (ref 3.87–5.11)
RDW: 13.6 % (ref 11.5–15.5)
WBC: 5.5 K/uL (ref 4.0–10.5)

## 2024-04-12 LAB — COMPREHENSIVE METABOLIC PANEL WITH GFR
ALT: 12 U/L (ref 0–35)
AST: 15 U/L (ref 0–37)
Albumin: 4.5 g/dL (ref 3.5–5.2)
Alkaline Phosphatase: 73 U/L (ref 39–117)
BUN: 8 mg/dL (ref 6–23)
CO2: 28 meq/L (ref 19–32)
Calcium: 9.3 mg/dL (ref 8.4–10.5)
Chloride: 103 meq/L (ref 96–112)
Creatinine, Ser: 1.02 mg/dL (ref 0.40–1.20)
GFR: 66.46 mL/min (ref >60.00)
Glucose, Bld: 88 mg/dL (ref 70–99)
Potassium: 3.7 meq/L (ref 3.5–5.1)
Sodium: 138 meq/L (ref 135–145)
Total Bilirubin: 0.5 mg/dL (ref 0.2–1.2)
Total Protein: 7.3 g/dL (ref 6.0–8.3)

## 2024-04-12 LAB — VITAMIN B12: Vitamin B-12: 393 pg/mL (ref 211–911)

## 2024-04-12 LAB — IBC + FERRITIN
Ferritin: 16.7 ng/mL (ref 10.0–291.0)
Iron: 66 ug/dL (ref 42–145)
Saturation Ratios: 19.6 % — ABNORMAL LOW (ref 20.0–50.0)
TIBC: 336 ug/dL (ref 250.0–450.0)
Transferrin: 240 mg/dL (ref 212.0–360.0)

## 2024-04-12 LAB — TSH: TSH: 1.4 u[IU]/mL (ref 0.35–5.50)

## 2024-04-12 LAB — HEMOGLOBIN A1C: Hgb A1c MFr Bld: 5.9 % (ref 4.6–6.5)

## 2024-04-12 MED ORDER — KETOCONAZOLE 2 % EX CREA: 1.0000 | TOPICAL_CREAM | Freq: Every day | CUTANEOUS | 0 refills | Status: AC

## 2024-04-12 NOTE — Progress Notes (Signed)
 Patient ID: Kathleen Conway, female    DOB: [birth year], 43 y.o.   MRN: [id number]   Assessment & Plan:  Other fatigue -     Vitamin B12 -     TSH -     Comprehensive metabolic panel with GFR -     CBC with Differential/Platelet -     IBC + Ferritin -     Hemoglobin A1c  Elevated LDL cholesterol level -     Lipid panel  Tinea pedis of both feet  Need for vaccine for Td (tetanus-diphtheria) -     Tdap vaccine greater than or equal to 7yo IM  Need for pneumococcal vaccine -     Pneumococcal conjugate vaccine 20-valent  Perimenopausal  Weight gain  Other orders -     Ketoconazole ; Apply 1 Application topically daily.  Dispense: 60 g; Refill: 0      Assessment and Plan Assessment & Plan Hypertension Blood pressure is elevated at 142/110 mmHg, with a trend of increasing levels corresponding with weight gain. She experiences lightheadedness and occasional palpitations. Family history of hypertension is present. Recent stress due to kitchen issues may have contributed to dietary changes and increased fast food consumption. Antihypertensive medication, such as amlodipine or hydrochlorothiazide, may be considered if lifestyle modifications do not improve blood pressure. - Provide a blood pressure log for daily home monitoring - Encourage exercise and nutrition improvements - Consider starting antihypertensive medication such as amlodipine or hydrochlorothiazide if blood pressure does not improve - Order CBC, CMP, A1c, ferritin, lipid panel, TSH, and B12 tests  Weight Gain Reports steady weight gain despite increased physical activity and dietary changes, possibly related to perimenopausal changes and recent stressors, including increased fast food consumption. Discussed benefits of intermittent fasting and mindful eating, emphasizing portion control and balanced meals with carbohydrates in moderation. Recommended incorporating weightlifting and possibly using a weighted vest  during walks. - Encourage mindful eating with a focus on portion control and balanced meals - Suggest intermittent fasting as a structured eating plan - Recommend incorporating weightlifting and possibly using a weighted vest during walks  Menstrual Irregularities Reports heavier and longer menstrual periods with shorter intervals, along with cramps, headaches, and fatigue over the past year, possibly related to perimenopausal changes. Birth control was discussed but deferred due to concerns about blood pressure. Advised to start with ibuprofen  to manage symptoms and maintain hydration and physical activity. Future discussions with OB GYN may be necessary if symptoms persist. - Advise taking ibuprofen  400-600 mg twice to three times daily starting a few days before the period and continuing through the first couple of days - Encourage adequate hydration and increased physical activity - Discuss potential future use of birth control with OB GYN if symptoms persist  Tinea Pedis Presents with symptoms consistent with tinea pedis, including extremely dry, peeling skin on the feet. Over-the-counter treatments have been ineffective. Likely exacerbated by shared environments with her husband, who has athlete's foot. Advised to use ketoconazole  cream with a moisturizer. Discussed treating her husband to prevent reinfection and bleaching shared shower areas to reduce fungal load. - Prescribe ketoconazole  cream 60 grams to be used with a moisturizer - Advise to bleach the shower a couple of times a week if feasible - Encourage her husband to seek treatment for athlete's foot  General Health Maintenance Due for routine vaccinations and screenings. Asthma increases susceptibility to pneumonia. Discussed benefits of Tdap and pneumonia vaccinations, including protection against whooping cough and pneumonia. - Administer  Tdap and pneumonia vaccinations - Ensure up-to-date mammogram screenings through [facility]  Follow-up Follow-up needed to assess intervention effectiveness and monitor blood pressure and other health parameters. Emphasized importance of monitoring blood pressure and making lifestyle changes to potentially avoid medication. - Schedule follow-up appointment in 6-8 weeks to review lab results and blood pressure log      Return in about 6 weeks (around 05/24/2024) for blood pressure check.    Subjective:    Chief Complaint  Patient presents with   Annual Exam    Pt seen today for annual CPE; pt is fasting; no concerns, or health changes; would like to discuss tdap and prevar 20;     HPI Discussed the use of AI scribe software for clinical note transcription with the patient, who gave verbal consent to proceed.  History of Present Illness Kathleen Conway is a 43 year old female who presents with concerns about athlete's foot and elevated blood pressure.  She experiences symptoms consistent with athlete's foot, including extremely dry, white, powdery, and peeling skin in certain areas. Over-the-counter treatments have been ineffective. She does not experience itching. Her husband, who is in the [employer] and frequently wears boots, has a history of athlete's foot, and she shares the same shower.  She has noticed an upward trend in her blood pressure, which she associates with recent weight gain. No recent increase in stress is noted, and her anxiety and depression are currently better managed than in the past. She is attempting to address her weight through increased physical activity, including going to the gym two to three times a week and walking, as well as trying to eat differently. Despite these efforts, her weight continues to increase. She reports moments of extreme fatigue, particularly around her menstrual cycle, occasional lightheadedness, and a possible heart palpitation. There is a family history of hypertension, with her brother on medication for  extremely high blood pressure.  She describes changes in her menstrual cycle over the past year, with heavier and longer periods, and a shorter interval between cycles, now approximately three weeks. She experiences more cramps, headaches, tiredness, and cravings than before. She uses both pads and tampons, with the second day being the heaviest. No debilitating pain that causes her to miss work is reported.  Her social history includes a recent period of eating more fast food due to kitchen repairs, but she has returned to cooking at home. She has a supportive family, including her eight-year-old daughter, who provides emotional support regarding her weight concerns.     Past Medical History:  Diagnosis Date   Allergy     Anxiety    Anxiety and depression 08/15/2019   Asthma    Carpal tunnel syndrome during pregnancy    Depression    Phreesia 10/12/2020   GERD (gastroesophageal reflux disease)    History of Helicobacter pylori infection 08/15/2019   Hx of varicella    Migraines    Postpartum care following vaginal delivery (11/4) 07/26/2015   Vaginal Pap smear, abnormal    Vitamin D  deficiency 08/15/2019    Past Surgical History:  Procedure Laterality Date   24 HOUR PH STUDY N/A 07/02/2019   Procedure: 24 HOUR PH STUDY;  Surgeon: Abran Norleen SAILOR, MD;  Location: [facility];  Service: Endoscopy;  Laterality: N/A;   DILATION AND EVACUATION N/A 08/20/2014   Procedure: DILATATION AND EVACUATION;  Surgeon: Percilla Burly, MD;  Location: [facility];  Service: Gynecology;  Laterality: N/A;   ESOPHAGEAL MANOMETRY N/A 07/02/2019  Procedure: ESOPHAGEAL MANOMETRY (EM);  Surgeon: Abran Norleen SAILOR, MD;  Location: [facility];  Service: Endoscopy;  Laterality: N/A;   PH IMPEDANCE STUDY N/A 07/02/2019   Procedure: PH IMPEDANCE STUDY;  Surgeon: Abran Norleen SAILOR, MD;  Location: [facility];  Service: Endoscopy;  Laterality: N/A;   WISDOM TOOTH EXTRACTION      Family History  Problem Relation Age of  Onset   Cancer Mother 71       breast   Cancer Father 84       prostate   Stroke Maternal Grandfather    Cancer Maternal Grandfather        colon- 7s   Cancer Paternal Grandfather    Stroke Maternal Grandmother    Heart attack Paternal Grandmother    Cancer Maternal Aunt        Breast and lung   Cancer Maternal Uncle        Lung   Down syndrome Paternal Aunt     Social History   Tobacco Use   Smoking status: Never    Passive exposure: Current (ocassionally)   Smokeless tobacco: Never  Vaping Use   Vaping status: Never Used  Substance Use Topics   Alcohol use: Yes    Comment: socially-wine but none with pregnancy   Drug use: Never     Allergies  Allergen Reactions   Topamax Nausea And Vomiting    Review of Systems NEGATIVE UNLESS OTHERWISE INDICATED IN HPI      Objective:     BP (!) 142/110 (BP Location: Left Arm, Patient Position: Sitting)   Pulse 79   Temp 98.1 F (36.7 C) (Temporal)   Ht 5' 1 (1.549 m)   Wt 168 lb 6.4 oz (76.4 kg)   SpO2 98%   BMI 31.82 kg/m   Wt Readings from Last 3 Encounters:  04/12/24 168 lb 6.4 oz (76.4 kg)  12/09/23 161 lb 8 oz (73.3 kg)  11/17/23 162 lb 6.4 oz (73.7 kg)    BP Readings from Last 3 Encounters:  04/12/24 (!) 142/110  01/04/24 138/82  12/09/23 128/88     Physical Exam Vitals and nursing note reviewed.  Constitutional:      Appearance: Normal appearance. She is normal weight. She is not toxic-appearing.  HENT:     Head: Normocephalic and atraumatic.     Right Ear: Tympanic membrane, ear canal and external ear normal.     Left Ear: Tympanic membrane, ear canal and external ear normal.     Nose: Nose normal.     Mouth/Throat:     Mouth: Mucous membranes are moist.  Eyes:     Extraocular Movements: Extraocular movements intact.     Conjunctiva/sclera: Conjunctivae normal.     Pupils: Pupils are equal, round, and reactive to light.  Cardiovascular:     Rate and Rhythm: Normal rate and regular  rhythm.     Pulses: Normal pulses.     Heart sounds: Normal heart sounds.  Pulmonary:     Effort: Pulmonary effort is normal.     Breath sounds: Normal breath sounds.  Abdominal:     General: Abdomen is flat. Bowel sounds are normal.     Palpations: Abdomen is soft.  Musculoskeletal:        General: Normal range of motion.     Cervical back: Normal range of motion and neck supple.  Skin:    General: Skin is warm and dry.  Neurological:     General: No focal deficit present.  Mental Status: She is alert and oriented to person, place, and time.  Psychiatric:        Mood and Affect: Mood normal.        Behavior: Behavior normal.        Thought Content: Thought content normal.        Judgment: Judgment normal.             Sandralee Tarkington M Taffie Eckmann, PA-C

## 2024-04-14 ENCOUNTER — Ambulatory Visit: Payer: Self-pay | Admitting: Physician Assistant

## 2024-05-23 ENCOUNTER — Ambulatory Visit: Admitting: Internal Medicine

## 2024-05-23 ENCOUNTER — Other Ambulatory Visit: Payer: Self-pay

## 2024-05-23 ENCOUNTER — Encounter: Payer: Self-pay | Admitting: Internal Medicine

## 2024-05-23 ENCOUNTER — Ambulatory Visit: Admitting: Physician Assistant

## 2024-05-23 ENCOUNTER — Encounter: Payer: Self-pay | Admitting: Physician Assistant

## 2024-05-23 VITALS — BP 116/82 | HR 74 | Temp 97.7°F | Wt 167.9 lb

## 2024-05-23 VITALS — BP 142/98 | HR 76 | Temp 98.2°F | Ht 61.0 in | Wt 168.8 lb

## 2024-05-23 DIAGNOSIS — I10 Essential (primary) hypertension: Secondary | ICD-10-CM | POA: Diagnosis not present

## 2024-05-23 DIAGNOSIS — G43009 Migraine without aura, not intractable, without status migrainosus: Secondary | ICD-10-CM | POA: Diagnosis not present

## 2024-05-23 DIAGNOSIS — E7800 Pure hypercholesterolemia, unspecified: Secondary | ICD-10-CM

## 2024-05-23 DIAGNOSIS — J3089 Other allergic rhinitis: Secondary | ICD-10-CM | POA: Diagnosis not present

## 2024-05-23 DIAGNOSIS — R7309 Other abnormal glucose: Secondary | ICD-10-CM

## 2024-05-23 DIAGNOSIS — Z566 Other physical and mental strain related to work: Secondary | ICD-10-CM

## 2024-05-23 DIAGNOSIS — J45991 Cough variant asthma: Secondary | ICD-10-CM | POA: Diagnosis not present

## 2024-05-23 MED ORDER — METOPROLOL SUCCINATE ER 25 MG PO TB24: 25.0000 mg | ORAL_TABLET | Freq: Every day | ORAL | 2 refills | Status: DC

## 2024-05-23 MED ORDER — BREZTRI AEROSPHERE 160-9-4.8 MCG/ACT IN AERO: INHALATION_SPRAY | RESPIRATORY_TRACT | 3 refills | Status: AC

## 2024-05-23 MED ORDER — MONTELUKAST SODIUM 10 MG PO TABS: 10.0000 mg | ORAL_TABLET | Freq: Every day | ORAL | 1 refills | Status: AC

## 2024-05-23 MED ORDER — ALBUTEROL SULFATE HFA 108 (90 BASE) MCG/ACT IN AERS: 2.0000 | INHALATION_SPRAY | Freq: Four times a day (QID) | RESPIRATORY_TRACT | 1 refills | Status: AC | PRN

## 2024-05-23 MED ORDER — CETIRIZINE HCL 10 MG PO TABS: 10.0000 mg | ORAL_TABLET | Freq: Every day | ORAL | 1 refills | Status: AC

## 2024-05-23 MED ORDER — AZELASTINE HCL 0.1 % NA SOLN: 2.0000 | Freq: Two times a day (BID) | NASAL | 5 refills | Status: AC | PRN

## 2024-05-23 NOTE — Progress Notes (Signed)
 Patient ID: Kathleen Conway, female    DOB: 1981-04-05, 43 y.o.   MRN: [id number]   Assessment & Plan:  Work-related stress  Essential hypertension  Elevated LDL cholesterol level  Elevated hemoglobin A1c  Other orders -     Metoprolol  Succinate ER; Take 1 tablet (25 mg total) by mouth daily.  Dispense: 30 tablet; Refill: 2      Assessment and Plan Assessment & Plan Essential hypertension Blood pressure readings are variable, with elevated diastolic pressure, likely related to stress. Higher readings occur during work hours, with lower readings in the evening. Family history of hypertension is present. - Initiate metoprolol  XL, low dose, to be taken in the evening to lower blood pressure and manage stress. - Recheck blood pressure in 8 weeks to assess medication effectiveness.  Elevated LDL cholesterol  Cholesterol levels are increasing despite dietary changes, including reduced soda intake. Family history of high cholesterol is present. A CT cardiac calcium score may be considered if lifestyle changes are insufficient. - Re-evaluate cholesterol levels in 6 months to assess the impact of lifestyle changes.  Abnormal A1c A1c levels have increased, likely due to high soda consumption. She has significantly reduced soda intake and is making dietary changes. - Monitor A1c levels in 6 months to assess the impact of dietary changes.  Migraine Migraines occur once or twice a month, triggered by stress, strong scents, and weather changes. She manages migraines with Excedrin Migraine as needed. Beta blockers may help reduce migraine episodes. - Continue current management of migraines and avoid known triggers.  Stress at work  Improvement in symptoms after discontinuing sertraline , which was stopped due to concerns about increased blood pressure. Stress at work contributes to blood pressure fluctuations. Beta blockers may help manage stress-related physiological responses. -  Consider beta blocker (metoprolol  XL) to help manage stress-related physiological responses without using traditional anti-anxiety or antidepressant medications.      Return in about 8 weeks (around 07/18/2024) for recheck/follow-up, blood pressure check.    Subjective:    Chief Complaint  Patient presents with   Hypertension    Pt in office for 6 wk HTN follow up and BP check; pt has been checking BP at home usually BID am and pm checks; pt brought print out with readings; pt completed Colonoscopy and Mammogram for this year and results requested from [facility] and [facility]. Prev BP check this am 137/95 per patient    HPI Discussed the use of AI scribe software for clinical note transcription with the patient, who gave verbal consent to proceed.  History of Present Illness Kathleen Conway is a 43 year old female with hypertension who presents with fluctuating blood pressure readings.  She has been experiencing inconsistent blood pressure readings, with values ranging from normal to very high, particularly concerning for elevated diastolic pressure. She has been monitoring her blood pressure since July using an electric arm cuff. Her blood pressure tends to be higher during the day, especially at work when under stress, and lower in the evenings when relaxed. She recalls a specific reading of 137/95 taken in the morning after showering and rushing, and another reading of 125/81 taken at 10:40 PM while watching TV. She attributes some fluctuations to stress, noting higher readings during stressful work situations.  No headaches or flushing during the day, although she has a history of migraines, which she manages by avoiding known triggers. Her eyes are constantly red, which she attributes to allergies.  She has  been making lifestyle changes, including reducing her intake of [facility], which she used to consume regularly during stressful workdays, having only had two since  her last visit. She has also started going back to the gym and is working on losing weight, which she believes contributes to her blood pressure issues. She has cut back on soda consumption and is trying sugar-free options. She has a history of elevated cholesterol and an increased A1c, which she is monitoring. She has been working with a Camera operator at work to develop an exercise plan and is trying to incorporate more physical activity into her routine, despite a busy schedule with her children's activities.  She previously took medication for depression and anxiety but stopped due to concerns about its impact on her blood pressure. She feels better mentally since stopping the medication, attributing improvements to her healthier lifestyle changes. She acknowledges that stress, particularly from work, is a significant factor in her health issues.  She has a family history of high blood pressure, with several family members, including her younger brother, on blood pressure medication. She is motivated to manage her condition through lifestyle changes to avoid medication if possible.     Past Medical History:  Diagnosis Date   Allergy     Anxiety    Anxiety and depression 08/15/2019   Asthma    Carpal tunnel syndrome during pregnancy    Depression    Phreesia 10/12/2020   GERD (gastroesophageal reflux disease)    History of Helicobacter pylori infection 08/15/2019   Hx of varicella    Migraines    Postpartum care following vaginal delivery (11/4) 07/26/2015   Vaginal Pap smear, abnormal    Vitamin D  deficiency 08/15/2019    Past Surgical History:  Procedure Laterality Date   24 HOUR PH STUDY N/A 07/02/2019   Procedure: 24 HOUR PH STUDY;  Surgeon: Abran Norleen SAILOR, MD;  Location: [facility];  Service: Endoscopy;  Laterality: N/A;   DILATION AND EVACUATION N/A 08/20/2014   Procedure: DILATATION AND EVACUATION;  Surgeon: Percilla Burly, MD;  Location: [facility];  Service:  Gynecology;  Laterality: N/A;   ESOPHAGEAL MANOMETRY N/A 07/02/2019   Procedure: ESOPHAGEAL MANOMETRY (EM);  Surgeon: Abran Norleen SAILOR, MD;  Location: [facility];  Service: Endoscopy;  Laterality: N/A;   PH IMPEDANCE STUDY N/A 07/02/2019   Procedure: PH IMPEDANCE STUDY;  Surgeon: Abran Norleen SAILOR, MD;  Location: [facility];  Service: Endoscopy;  Laterality: N/A;   WISDOM TOOTH EXTRACTION      Family History  Problem Relation Age of Onset   Cancer Mother 35       breast   Cancer Father 3       prostate   Stroke Maternal Grandfather    Cancer Maternal Grandfather        colon- 37s   Cancer Paternal Grandfather    Stroke Maternal Grandmother    Heart attack Paternal Grandmother    Cancer Maternal Aunt        Breast and lung   Cancer Maternal Uncle        Lung   Down syndrome Paternal Aunt     Social History   Tobacco Use   Smoking status: Never    Passive exposure: Current (ocassionally)   Smokeless tobacco: Never  Vaping Use   Vaping status: Never Used  Substance Use Topics   Alcohol use: Yes    Comment: socially-wine but none with pregnancy   Drug use: Never     Allergies  Allergen Reactions   Topamax Nausea And Vomiting    Review of Systems NEGATIVE UNLESS OTHERWISE INDICATED IN HPI      Objective:     BP (!) 142/98 (BP Location: Left Arm, Patient Position: Sitting, Cuff Size: Normal) Comment (Patient Position): Manually  Pulse 76   Temp 98.2 F (36.8 C) (Temporal)   Ht 5' 1 (1.549 m)   Wt 168 lb 12.8 oz (76.6 kg)   SpO2 98%   BMI 31.89 kg/m   Wt Readings from Last 3 Encounters:  05/23/24 167 lb 14.4 oz (76.2 kg)  05/23/24 168 lb 12.8 oz (76.6 kg)  04/12/24 168 lb 6.4 oz (76.4 kg)    BP Readings from Last 3 Encounters:  05/23/24 116/82  05/23/24 (!) 142/98  04/12/24 (!) 142/110     Physical Exam Vitals and nursing note reviewed.  Constitutional:      Appearance: Normal appearance. She is normal weight. She is not toxic-appearing.  HENT:      Head: Normocephalic and atraumatic.     Right Ear: External ear normal.     Left Ear: External ear normal.  Eyes:     Extraocular Movements: Extraocular movements intact.     Conjunctiva/sclera: Conjunctivae normal.     Pupils: Pupils are equal, round, and reactive to light.  Cardiovascular:     Rate and Rhythm: Normal rate and regular rhythm.     Pulses: Normal pulses.     Heart sounds: Normal heart sounds.  Pulmonary:     Effort: Pulmonary effort is normal.     Breath sounds: Normal breath sounds.  Musculoskeletal:        General: Normal range of motion.     Cervical back: Normal range of motion and neck supple.  Skin:    General: Skin is warm and dry.  Neurological:     General: No focal deficit present.     Mental Status: She is alert and oriented to person, place, and time.  Psychiatric:        Mood and Affect: Mood normal.        Behavior: Behavior normal.        Thought Content: Thought content normal.        Judgment: Judgment normal.             Miaa Latterell M Parrie Rasco, PA-C

## 2024-05-23 NOTE — Addendum Note (Signed)
 Addended by: MELITON ROSINA PARAS on: 05/23/2024 04:12 PM   Modules accepted: Orders

## 2024-05-23 NOTE — Progress Notes (Signed)
 FOLLOW UP Date of Service/Encounter:  05/23/24   Subjective:  Kathleen Conway (DOB: [birth year]) is a 43 y.o. female who [facility] for follow up for cough variant asthma and allergic rhinitis.   History obtained from: chart review and patient. Last seen on 01/04/2024 with me and was doing well with cough since starting Breztri  and Singulair .  Rhinitis also well controlled on Zyrtec /Singulair , not using nose sprays regularly.   Doing well since last visit.  Not much trouble with cough.  Around May, he stopped the Breztri  and continued Singulair  and has done very well.  Denies frequent albuterol  use. No ER visits/oral prednisone  use.  Rhinitis is doing well too.  Not much congestion, drainage, sneezing.  Does not like daily use of nose sprays so not doing Flonase .  Using Zyrtec  and Singulair  daily though.     Past Medical History: Past Medical History:  Diagnosis Date   Allergy     Anxiety    Anxiety and depression 08/15/2019   Asthma    Carpal tunnel syndrome during pregnancy    Depression    Phreesia 10/12/2020   GERD (gastroesophageal reflux disease)    History of Helicobacter pylori infection 08/15/2019   Hx of varicella    Migraines    Postpartum care following vaginal delivery (11/4) 07/26/2015   Vaginal Pap smear, abnormal    Vitamin D  deficiency 08/15/2019    Objective:  BP 116/82 (BP Location: Left Arm, Patient Position: Sitting, Cuff Size: Normal)   Pulse 74   Temp 97.7 F (36.5 C) (Temporal)   Wt 167 lb 14.4 oz (76.2 kg)   SpO2 100%   BMI 31.72 kg/m  Body mass index is 31.72 kg/m. Physical Exam: GEN: alert, well developed HEENT: clear conjunctiva, nose with mild inferior turbinate hypertrophy, pink nasal mucosa, no rhinorrhea, no cobblestoning HEART: regular rate and rhythm, no murmur LUNGS: clear to auscultation bilaterally, no coughing, unlabored respiration SKIN: no rashes or lesions  Spirometry:   Tracings reviewed. Her effort: Good reproducible efforts. FVC: 2.29L, 83% predicted  FEV1: 1.9L, 84% predicted FEV1/FVC ratio: 83% Interpretation: Spirometry consistent with normal pattern.  Please see scanned spirometry results for details.  Assessment:   1. Perennial allergic rhinitis   2. Cough variant asthma     Plan/Recommendations:  Cough Variant Asthma:  - Well controlled.  Normal spirometry today.  MDI technique discussed. Will transition to block therapy Breztri  and continue Singulair .  - With respiratory illness or flare ups, start Breztri  160-9-4.49mcg 2 puffs twice daily for 1-2 weeks.  - Maintenance inhaler: continue Singulair  10mg  daily.  - Rescue inhaler: Albuterol  2 puffs or 1 vial via nebulizer every 4-6 hours as needed for respiratory symptoms of  shortness of breath, or wheezing Asthma control goals:  Full participation in all desired activities (may need albuterol  before activity) Albuterol  use two times or less a week on average (not counting use with activity) Cough interfering with sleep two times or less a month Oral steroids no more than once a year No hospitalizations   Allergic Rhinitis: - Well controlled, does not like daily use of nose sprays, will use Azelastine  PRN.   - Positive skin test 3/20201: dust mites and cockroach - Use nasal saline rinses before nose sprays such as with Neilmed Sinus Rinse.  Use distilled water.   - Use Azelastine  2 sprays each nostril twice daily as needed for congestion/drainage/runny nose. Aim upward and outward. - Use Zyrtec  10 mg daily.  -  Use Singulair  10mg  daily. Stop if there are any mood/behavioral changes. - Consider allergy  shots as long term control of your symptoms by teaching your immune system to be more tolerant of your allergy  triggers    Return in about 6 months (around 11/20/2024).  Arleta Blanch, MD [facility] 

## 2024-05-23 NOTE — Patient Instructions (Addendum)
 Cough Variant Asthma:  - With respiratory illness or flare ups, start Breztri  160-9-4.85mcg 2 puffs twice daily for 1-2 weeks.  - Maintenance inhaler: continue Singulair  10mg  daily.  - Rescue inhaler: Albuterol  2 puffs or 1 vial via nebulizer every 4-6 hours as needed for respiratory symptoms of  shortness of breath, or wheezing Asthma control goals:  Full participation in all desired activities (may need albuterol  before activity) Albuterol  use two times or less a week on average (not counting use with activity) Cough interfering with sleep two times or less a month Oral steroids no more than once a year No hospitalizations   Allergic Rhinitis:  - Positive skin test 3/20201: dust mites and cockroach - Use nasal saline rinses before nose sprays such as with Neilmed Sinus Rinse.  Use distilled water.   - Use Azelastine  2 sprays each nostril twice daily as needed for congestion/drainage/runny nose. Aim upward and outward. - Use Zyrtec  10 mg daily.  - Use Singulair  10mg  daily. Stop if there are any mood/behavioral changes. - Consider allergy  shots as long term control of your symptoms by teaching your immune system to be more tolerant of your allergy  triggers

## 2024-05-24 NOTE — Patient Instructions (Signed)
  VISIT SUMMARY: Today, we discussed your fluctuating blood pressure readings and made some adjustments to your treatment plan. We also reviewed your cholesterol levels, blood sugar, migraines, and mental health.  YOUR PLAN: ESSENTIAL HYPERTENSION: Your blood pressure readings have been inconsistent, with higher readings during stressful times at work and lower readings in the evening. -Start taking metoprolol  XL at a low dose in the evening to help lower your blood pressure and manage stress. -Recheck your blood pressure in 8 weeks to see how the medication is working.  PURE HYPERCHOLESTEROLEMIA: Your cholesterol levels are still high despite making some dietary changes. -We will re-evaluate your cholesterol levels in 6 months to see if the lifestyle changes are helping.  ABNORMAL GLUCOSE: Your A1c levels have increased, likely due to high soda consumption, but you have significantly reduced your intake. -Monitor your A1c levels in 6 months to see if the dietary changes are effective.  MIGRAINE: You experience migraines once or twice a month, usually triggered by stress, strong scents, and weather changes. -Continue managing your migraines with Excedrin Migraine as needed and avoid known triggers.  DEPRESSION AND ANXIETY: You have noticed an improvement in your symptoms after stopping sertraline . Stress at work is a significant factor in your blood pressure fluctuations. -Consider using metoprolol  XL to help manage stress-related physical responses without needing traditional anti-anxiety or antidepressant medications.                      Contains text generated by [employer].                                 Contains text generated by [employer].

## 2024-05-30 ENCOUNTER — Ambulatory Visit: Admitting: Physician Assistant

## 2024-05-30 NOTE — Addendum Note (Signed)
 Addended by: Enslee Bibbins on: 05/30/2024 03:36 PM   Modules accepted: Orders

## 2024-05-31 ENCOUNTER — Encounter: Admitting: Physician Assistant

## 2024-07-23 ENCOUNTER — Ambulatory Visit: Admitting: Physician Assistant

## 2024-08-08 ENCOUNTER — Encounter: Payer: Self-pay | Admitting: Physician Assistant

## 2024-08-08 ENCOUNTER — Ambulatory Visit: Admitting: Physician Assistant

## 2024-08-08 VITALS — BP 130/84 | HR 78 | Temp 98.2°F | Ht 61.0 in | Wt 164.4 lb

## 2024-08-08 DIAGNOSIS — Z566 Other physical and mental strain related to work: Secondary | ICD-10-CM | POA: Diagnosis not present

## 2024-08-08 DIAGNOSIS — I10 Essential (primary) hypertension: Secondary | ICD-10-CM

## 2024-08-08 DIAGNOSIS — E66811 Obesity, class 1: Secondary | ICD-10-CM | POA: Insufficient documentation

## 2024-08-08 DIAGNOSIS — G43009 Migraine without aura, not intractable, without status migrainosus: Secondary | ICD-10-CM | POA: Diagnosis not present

## 2024-08-08 MED ORDER — METOPROLOL SUCCINATE ER 25 MG PO TB24: 25.0000 mg | ORAL_TABLET | Freq: Every day | ORAL | 1 refills | Status: AC

## 2024-08-08 NOTE — Progress Notes (Signed)
 Patient ID: Kathleen Conway, female    DOB: 1981/03/21, 43 y.o.   MRN: [id number]   Assessment & Plan:  Essential hypertension -     Metoprolol  Succinate ER; Take 1 tablet (25 mg total) by mouth daily.  Dispense: 90 tablet; Refill: 1  Work-related stress  Migraine without aura and without status migrainosus, not intractable      Assessment and Plan Assessment & Plan Essential hypertension Blood pressure improved to 130/84 mmHg from previous readings in September. Toprol  XL 25 mg daily is effective in managing blood pressure and stress. She reports difficulty maintaining lifestyle changes due to a busy schedule but remains more conscientious than before. - Continue Toprol  XL 25 mg daily - Sent refill to [facility] - Advised to monitor blood pressure and report if readings exceed 150 mmHg  Migraine Migraines are well-controlled with current medication regimen. She reports having one or two mild migraines that were manageable with medication. - Continue current migraine management plan  Work-related stress Experiencing significant work-related stress due to a busy season and work demands. Stress management is aided by Toprol  XL, which also helps with blood pressure control. She is attempting to reduce caffeine intake as a stress management strategy. - Continue stress management strategies - Encouraged reduction of caffeine intake     F/up as scheduled for CPE next year     Subjective:    Chief Complaint  Patient presents with   Hypertension    Pt in office for work related stress blood pressure concerns and BP check; work is getting busier right now; pt not checking BP at home; stressors at work is probably worse per pt sue to this being their busy season    Hypertension   Discussed the use of AI scribe software for clinical note transcription with the patient, who gave verbal consent to proceed.  History of Present Illness Kathleen Conway is a 43 year  old female with hypertension who presents for a follow-up visit.  She was last seen in September and was started on Toprol  XL 25 mg daily to manage her blood pressure, stress, and migraines. She initially monitored her blood pressure regularly and noticed improvements but has since become inconsistent with checks. She takes Toprol  XL as prescribed and feels it has helped with her blood pressure.  She has a history of migraines. While the frequency has not significantly changed, recent migraines have been manageable with medication and have not been debilitating.  She experiences watery eyes, which she attributes to allergies. She is taking Zyrtec  and montelukast  for allergies and uses eye drops as needed for redness.  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 Work stress often leads her to consume sodas for caffeine, although she is trying to reduce her intake. She is looking forward to taking time off during the holidays to relax and spend time with her family.     Past Medical History:  Diagnosis Date   Allergy     Anxiety    Anxiety and depression 08/15/2019   Asthma    Carpal tunnel syndrome during pregnancy    Depression    Phreesia 10/12/2020   GERD (gastroesophageal reflux disease)    History of Helicobacter pylori infection 08/15/2019   Hx of varicella    Migraines    Postpartum care following vaginal delivery (11/4) 07/26/2015   Vaginal Pap smear, abnormal    Vitamin D  deficiency  08/15/2019    Past Surgical History:  Procedure Laterality Date   21 HOUR PH STUDY N/A 07/02/2019   Procedure: 24 HOUR PH STUDY;  Surgeon: Abran Norleen SAILOR, MD;  Location: [facility];  Service: Endoscopy;  Laterality: N/A;   DILATION AND EVACUATION N/A 08/20/2014   Procedure: DILATATION AND EVACUATION;  Surgeon: Percilla Burly, MD;  Location: [facility];  Service: Gynecology;  Laterality: N/A;    ESOPHAGEAL MANOMETRY N/A 07/02/2019   Procedure: ESOPHAGEAL MANOMETRY (EM);  Surgeon: Abran Norleen SAILOR, MD;  Location: [facility];  Service: Endoscopy;  Laterality: N/A;   PH IMPEDANCE STUDY N/A 07/02/2019   Procedure: PH IMPEDANCE STUDY;  Surgeon: Abran Norleen SAILOR, MD;  Location: [facility];  Service: Endoscopy;  Laterality: N/A;   WISDOM TOOTH EXTRACTION      Family History  Problem Relation Age of Onset   Cancer Mother 2       breast   Cancer Father 45       prostate   Stroke Maternal Grandfather    Cancer Maternal Grandfather        colon- 63s   Cancer Paternal Grandfather    Stroke Maternal Grandmother    Heart attack Paternal Grandmother    Cancer Maternal Aunt        Breast and lung   Cancer Maternal Uncle        Lung   Down syndrome Paternal Aunt     Social History   Tobacco Use   Smoking status: Never    Passive exposure: Current (ocassionally)   Smokeless tobacco: Never  Vaping Use   Vaping status: Never Used  Substance Use Topics   Alcohol use: Yes    Comment: socially-wine but none with pregnancy   Drug use: Never     Allergies  Allergen Reactions   Topamax Nausea And Vomiting    Review of Systems NEGATIVE UNLESS OTHERWISE INDICATED IN HPI      Objective:     BP 130/84 (BP Location: Left Arm, Patient Position: Sitting, Cuff Size: Normal)   Pulse 78   Temp 98.2 F (36.8 C) (Temporal)   Ht 5' 1 (1.549 m)   Wt 164 lb 6.4 oz (74.6 kg)   LMP 07/22/2024 (Approximate)   SpO2 98%   BMI 31.06 kg/m   Wt Readings from Last 3 Encounters:  08/08/24 164 lb 6.4 oz (74.6 kg)  05/23/24 167 lb 14.4 oz (76.2 kg)  05/23/24 168 lb 12.8 oz (76.6 kg)    BP Readings from Last 3 Encounters:  08/08/24 130/84  05/23/24 116/82  05/23/24 (!) 142/98     Physical Exam Vitals and nursing note reviewed.  Constitutional:      Appearance: Normal appearance. She is normal weight. She is not toxic-appearing.  HENT:     Head: Normocephalic and atraumatic.      Right Ear: External ear normal.     Left Ear: External ear normal.  Eyes:     Extraocular Movements: Extraocular movements intact.     Conjunctiva/sclera: Conjunctivae normal.     Pupils: Pupils are equal, round, and reactive to light.  Cardiovascular:     Rate and Rhythm: Normal rate and regular rhythm.     Pulses: Normal pulses.     Heart sounds: Normal heart sounds.  Pulmonary:     Effort: Pulmonary effort is normal.     Breath sounds: Normal breath sounds.  Musculoskeletal:        General: Normal range of motion.  Cervical back: Normal range of motion and neck supple.  Skin:    General: Skin is warm and dry.  Neurological:     General: No focal deficit present.     Mental Status: She is alert and oriented to person, place, and time.  Psychiatric:        Mood and Affect: Mood normal.        Behavior: Behavior normal.        Thought Content: Thought content normal.        Judgment: Judgment normal.             Mahek Schlesinger M Darnella Zeiter, PA-C

## 2024-08-09 ENCOUNTER — Ambulatory Visit: Admitting: Physician Assistant

## 2024-11-21 ENCOUNTER — Ambulatory Visit: Admitting: Family

## 2025-04-18 ENCOUNTER — Encounter: Admitting: Physician Assistant
